# Patient Record
Sex: Female | Born: 1955
Health system: Southern US, Community
[De-identification: ages and names within clinical notes are randomized; demographics above are authoritative.]

## PROBLEM LIST (undated history)

## (undated) DIAGNOSIS — I10 Essential (primary) hypertension: Secondary | ICD-10-CM

## (undated) DIAGNOSIS — Z8781 Personal history of (healed) traumatic fracture: Secondary | ICD-10-CM

## (undated) DIAGNOSIS — E871 Hypo-osmolality and hyponatremia: Secondary | ICD-10-CM

## (undated) DIAGNOSIS — C4491 Basal cell carcinoma of skin, unspecified: Secondary | ICD-10-CM

## (undated) DIAGNOSIS — H3560 Retinal hemorrhage, unspecified eye: Secondary | ICD-10-CM

## (undated) DIAGNOSIS — I358 Other nonrheumatic aortic valve disorders: Secondary | ICD-10-CM

## (undated) DIAGNOSIS — E162 Hypoglycemia, unspecified: Secondary | ICD-10-CM

## (undated) DIAGNOSIS — F419 Anxiety disorder, unspecified: Secondary | ICD-10-CM

## (undated) DIAGNOSIS — K900 Celiac disease: Secondary | ICD-10-CM

## (undated) DIAGNOSIS — I4510 Unspecified right bundle-branch block: Secondary | ICD-10-CM

## (undated) HISTORY — DX: Hypoglycemia, unspecified: E16.2

## (undated) HISTORY — DX: Anxiety disorder, unspecified: F41.9

## (undated) HISTORY — DX: Other nonrheumatic aortic valve disorders: I35.8

## (undated) HISTORY — DX: Celiac disease: K90.0

## (undated) HISTORY — PX: APPENDECTOMY: SHX54

## (undated) HISTORY — PX: ANKLE SURGERY: SHX546

## (undated) HISTORY — DX: Unspecified right bundle-branch block: I45.10

## (undated) HISTORY — DX: Hypo-osmolality and hyponatremia: E87.1

## (undated) HISTORY — DX: Retinal hemorrhage, unspecified eye: H35.60

## (undated) HISTORY — DX: Essential (primary) hypertension: I10

## (undated) HISTORY — PX: TUBAL LIGATION: SHX77

## (undated) HISTORY — DX: Basal cell carcinoma of skin, unspecified: C44.91

---
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
## 1992-01-12 DIAGNOSIS — E162 Hypoglycemia, unspecified: Secondary | ICD-10-CM

## 1992-01-12 HISTORY — DX: Hypoglycemia, unspecified: E16.2

## 1995-01-12 HISTORY — PX: TUBAL LIGATION: SHX77

## 2006-01-11 HISTORY — PX: ANKLE FRACTURE SURGERY: SHX122

## 2008-02-05 HISTORY — PX: COLONOSCOPY: SHX174

## 2012-02-16 HISTORY — PX: UPPER GI ENDOSCOPY: SHX6162

## 2013-06-11 HISTORY — PX: COLONOSCOPY: SHX174

## 2015-11-11 DIAGNOSIS — J302 Other seasonal allergic rhinitis: Secondary | ICD-10-CM

## 2015-11-11 DIAGNOSIS — I10 Essential (primary) hypertension: Secondary | ICD-10-CM

## 2015-11-11 HISTORY — DX: Other seasonal allergic rhinitis: J30.2

## 2015-11-11 HISTORY — DX: Essential (primary) hypertension: I10

## 2016-12-30 DIAGNOSIS — M7751 Other enthesopathy of right foot: Secondary | ICD-10-CM

## 2016-12-30 DIAGNOSIS — M2041 Other hammer toe(s) (acquired), right foot: Secondary | ICD-10-CM | POA: Insufficient documentation

## 2016-12-30 HISTORY — DX: Other enthesopathy of right foot and ankle: M77.51

## 2016-12-30 HISTORY — DX: Other hammer toe(s) (acquired), right foot: M20.41

## 2017-03-02 ENCOUNTER — Encounter: Payer: Self-pay | Admitting: Gastroenterology

## 2017-05-24 ENCOUNTER — Encounter: Payer: Self-pay | Admitting: Gastroenterology

## 2017-06-22 ENCOUNTER — Encounter: Payer: Self-pay | Admitting: Gastroenterology

## 2017-06-24 ENCOUNTER — Ambulatory Visit (INDEPENDENT_AMBULATORY_CARE_PROVIDER_SITE_OTHER): Payer: BC Managed Care – PPO | Admitting: Gastroenterology

## 2017-06-24 ENCOUNTER — Encounter: Payer: Self-pay | Admitting: Gastroenterology

## 2017-06-24 VITALS — BP 110/78 | HR 57 | Ht 66.0 in | Wt 154.5 lb

## 2017-06-24 DIAGNOSIS — K900 Celiac disease: Secondary | ICD-10-CM

## 2017-06-24 NOTE — Progress Notes (Signed)
IMPRESSION and PLAN:    #1. Celiac Disease (she is compliant with  gluten-free diet-negative celiac antibodies during the last blood draw lab results from) - Lab results from Dr Felipa [facility] office.  (Normal TSH). Last EGD 02/2012-scalloping of duodenal mucosa consistent with celiac disease with positive biopsies. - Check CBC, CMP, LDH, B12, Mg today     #2. Weight Loss with fatigue with occasional left lower quadrant pain.  - Since patient has weight loss and has been compliant with gluten-free diet, we need to rule out any lymphomas.  We will proceed with CT scan of the abdomen and pelvis with p.o. and IV contrast. - FU 12 weeks, earlier if still with problems. - Next colonoscopy due 06/2023      HPI:    Chief Complaint:   Debra Mueller is a 62 y.o. female  FU celiac Extreme fatigue. Weight 169 to 154 over the last 6 months. She has been compliant with gluten-free diet. Her most recent celiac antibodies were negative consistent with compliance to gluten-free diet. She had normal TSH Has been started on Zoloft 25 mg p.o. daily with some relief. Has to get up twice at night for urination. Vague left-sided abdominal discomfort No nausea vomiting heartburn regurgitation odynophagia or dysphagia.  Last colonoscopy 06/2013 was negative except for moderate sigmoid diverticulosis.  Random biopsies were negative for microscopic colitis.  It was recommended to repeat colonoscopy in 10 years.  Earlier, if she starts having any new problems.   Past Medical History:  Diagnosis Date  . Anxiety disorder   . Basal cell carcinoma   . Celiac disease   . Hypertension   . Hypoglycemia 1994   in [place]  . Hyposmolality     Current Outpatient Medications  Medication Sig Dispense Refill  . B Complex-C (B-COMPLEX WITH VITAMIN C) tablet Take 1 tablet by mouth daily.    . Calcium Carb-Cholecalciferol (CALCIUM 600+D3 PO) Take by mouth.    . mometasone (NASONEX) 50 MCG/ACT nasal spray  Place 2 sprays into the nose daily.    . montelukast (SINGULAIR) 10 MG tablet Take 10 mg by mouth at bedtime.    . nebivolol (BYSTOLIC) 5 MG tablet Take 5 mg by mouth daily.    . sertraline (ZOLOFT) 25 MG tablet Take 25 mg by mouth daily.     No current facility-administered medications for this visit.     Past Surgical History:  Procedure Laterality Date  . ANKLE SURGERY    . APPENDECTOMY    . COLONOSCOPY  02/05/2008   Mild Sigmoid diverticulosis. Small internal hemorrhoids. Otherwise normal. Colon Redundant.  . COLONOSCOPY  06/11/2013   Moderate sigmoid diverticulosis. otherwise normal. Bx: Prominent benign lymphoid aggregate, otherwise normal  . TUBAL LIGATION    . UPPER GI ENDOSCOPY  02/16/2012   Scalloping of dupdenal mucosa, suggestive of celiac disease. Small Bowel Bx: Findings consistent with Celiac Disease.    Family History  Problem Relation Age of Onset  . Atrial fibrillation Father   . Hypertension Father   . Prostate cancer Maternal Grandfather   . Diabetes Paternal Grandfather   . Prostate cancer Maternal Uncle     Social History   Tobacco Use  . Smoking status: Former Smoker    Last attempt to quit: 2000    Years since quitting: 19.4  . Smokeless tobacco: Never Used  Substance Use Topics  . Alcohol use: Yes    Alcohol/week: 12.6 oz    Types: 21 Cans of beer  per week  . Drug use: Never    Allergies  Allergen Reactions  . Sulfa Antibiotics      Review of Systems: All systems reviewed and negative except where noted in HPI.    Physical Exam:     BP 110/78   Pulse (!) 57   Ht 5\' 6"  (1.676 m)   Wt 154 lb 8 oz (70.1 kg)   BMI 24.94 kg/m  @WEIGHTLAST3 @ GENERAL:  Alert, oriented, cooperative, not in acute distress. PSYCH: :Pleasant, normal mood and affect. HEENT:  conjunctiva pink, mucous membranes moist, neck supple without masses. No jaundice. CARDIAC:  S1 S2 normal. No murmers. PULM: Normal respiratory effort, lungs CTA bilaterally, no  wheezing. ABDOMEN: Inspection: No visible peristalsis, no abnormal pulsations, skin normal.  Palpation/percussion: Soft, nontender, nondistended, no rigidity, no abnormal dullness to percussion, no hepatosplenomegaly and no palpable abdominal masses.  Auscultation: Normal bowel sounds, no abdominal bruits. Rectal exam: Deferred SKIN:  turgor, no lesions seen. Musculoskeletal:  Normal muscle tone, normal strength. NEURO: Alert and oriented x 3, no focal neurologic deficits.    [name],MD 06/24/2017, 4:42 PM   CC Dr Laqueta Due

## 2017-06-24 NOTE — Patient Instructions (Addendum)
If you are age 62 or older, your body mass index should be between 23-30. Your Body mass index is 24.94 kg/m. If this is out of the aforementioned range listed, please consider follow up with your Primary Care Provider.  If you are age 31 or younger, your body mass index should be between 19-25. Your Body mass index is 24.94 kg/m. If this is out of the aformentioned range listed, please consider follow up with your Primary Care Provider.    You have been scheduled for a CT scan of the abdomen and pelvis [facility]  You are scheduled on  at . You should arrive 15 minutes prior to your appointment time for registration. Please follow the written instructions below on the day of your exam:  WARNING: IF YOU ARE ALLERGIC TO IODINE/X-RAY DYE, PLEASE NOTIFY RADIOLOGY IMMEDIATELY AT [phone]! YOU WILL BE GIVEN A 13 HOUR PREMEDICATION PREP.  1) Do not eat or drink anything after  (4 hours prior to your test) 2) You have been given 2 bottles of oral contrast to drink. The solution may taste better if refrigerated, but do NOT add ice or any other liquid to this solution. Shake well before drinking.    Drink 1 bottle of contrast @  (2 hours prior to your exam)  Drink 1 bottle of contrast @  (1 hour prior to your exam)  You may take any medications as prescribed with a small amount of water except for the following: Metformin, Glucophage, Glucovance, Avandamet, Riomet, Fortamet, Actoplus Met, Janumet, Glumetza or Metaglip. The above medications must be held the day of the exam AND 48 hours after the exam.  The purpose of you drinking the oral contrast is to aid in the visualization of your intestinal tract. The contrast solution may cause some diarrhea. Before your exam is started, you will be given a small amount of fluid to drink. Depending on your individual set of symptoms, you may also receive an intravenous injection of x-ray contrast/dye. Plan on being at [facility] for 30  minutes or longer, depending on the type of exam you are having performed.  This test typically takes 30-45 minutes to complete.  If you have any questions regarding your exam or if you need to reschedule, you may call the CT department at [phone] between the hours of 8:00 am and 5:00 pm, Monday-Friday.  ________________________________________________________________________  We have scheduled you an appointment to come back and have labs drawn.    Thank you,  Dr. Jackquline Denmark

## 2017-06-27 ENCOUNTER — Other Ambulatory Visit: Payer: BC Managed Care – PPO

## 2017-06-28 ENCOUNTER — Other Ambulatory Visit (INDEPENDENT_AMBULATORY_CARE_PROVIDER_SITE_OTHER): Payer: BC Managed Care – PPO

## 2017-06-28 DIAGNOSIS — K900 Celiac disease: Secondary | ICD-10-CM | POA: Diagnosis not present

## 2017-06-28 LAB — COMPREHENSIVE METABOLIC PANEL
ALT: 15 U/L (ref 0–35)
AST: 17 U/L (ref 0–37)
Albumin: 4.5 g/dL (ref 3.5–5.2)
Alkaline Phosphatase: 70 U/L (ref 39–117)
BUN: 10 mg/dL (ref 6–23)
CALCIUM: 9.7 mg/dL (ref 8.4–10.5)
CHLORIDE: 96 meq/L (ref 96–112)
CO2: 30 mEq/L (ref 19–32)
Creatinine, Ser: 0.61 mg/dL (ref 0.40–1.20)
GFR: 105.5 mL/min (ref >60.00)
Glucose, Bld: 90 mg/dL (ref 70–99)
POTASSIUM: 4.9 meq/L (ref 3.5–5.1)
Sodium: 133 mEq/L — ABNORMAL LOW (ref 135–145)
Total Bilirubin: 0.5 mg/dL (ref 0.2–1.2)
Total Protein: 6.9 g/dL (ref 6.0–8.3)

## 2017-06-28 LAB — CBC WITH DIFFERENTIAL/PLATELET
BASOS PCT: 0.6 % (ref 0.0–3.0)
Basophils Absolute: 0 10*3/uL (ref 0.0–0.1)
EOS PCT: 1.8 % (ref 0.0–5.0)
Eosinophils Absolute: 0.1 10*3/uL (ref 0.0–0.7)
HEMATOCRIT: 40.5 % (ref 36.0–46.0)
HEMOGLOBIN: 13.9 g/dL (ref 12.0–15.0)
LYMPHS PCT: 36 % (ref 12.0–46.0)
Lymphs Abs: 1.5 10*3/uL (ref 0.7–4.0)
MCHC: 34.2 g/dL (ref 30.0–36.0)
MCV: 94.4 fl (ref 78.0–100.0)
MONOS PCT: 11.8 % (ref 3.0–12.0)
Monocytes Absolute: 0.5 10*3/uL (ref 0.1–1.0)
Neutro Abs: 2.1 10*3/uL (ref 1.4–7.7)
Neutrophils Relative %: 49.8 % (ref 43.0–77.0)
PLATELETS: 231 10*3/uL (ref 150.0–400.0)
RBC: 4.29 Mil/uL (ref 3.87–5.11)
RDW: 13.1 % (ref 11.5–15.5)
WBC: 4.2 10*3/uL (ref 4.0–10.5)

## 2017-06-28 LAB — LACTATE DEHYDROGENASE: LDH: 126 U/L (ref 120–250)

## 2017-06-28 LAB — VITAMIN B12: Vitamin B-12: 307 pg/mL (ref 211–911)

## 2017-06-30 ENCOUNTER — Encounter (HOSPITAL_BASED_OUTPATIENT_CLINIC_OR_DEPARTMENT_OTHER): Payer: Self-pay

## 2017-06-30 ENCOUNTER — Ambulatory Visit (HOSPITAL_BASED_OUTPATIENT_CLINIC_OR_DEPARTMENT_OTHER)
Admission: RE | Admit: 2017-06-30 | Discharge: 2017-06-30 | Disposition: A | Discharge status: Home / Self Care | Payer: BC Managed Care – PPO | Source: Ambulatory Visit | Attending: Gastroenterology | Admitting: Gastroenterology

## 2017-06-30 DIAGNOSIS — K900 Celiac disease: Secondary | ICD-10-CM | POA: Diagnosis not present

## 2017-06-30 DIAGNOSIS — I700 Atherosclerosis of aorta: Secondary | ICD-10-CM | POA: Insufficient documentation

## 2017-06-30 MED ORDER — IOPAMIDOL (ISOVUE-300) INJECTION 61%
100.0000 mL | Freq: Once | INTRAVENOUS | Status: AC | PRN
  Administered 2017-06-30: 100 mL via INTRAVENOUS

## 2017-07-01 ENCOUNTER — Telehealth: Payer: Self-pay | Admitting: Gastroenterology

## 2017-07-01 NOTE — Telephone Encounter (Signed)
Pt calling again regarding her results. Pls call her.

## 2017-07-04 NOTE — Telephone Encounter (Signed)
I have not been able to reach her by phone so I mailed a copy of the CT report to her.

## 2017-12-05 ENCOUNTER — Ambulatory Visit: Payer: BC Managed Care – PPO | Admitting: Gastroenterology

## 2017-12-05 ENCOUNTER — Encounter: Payer: Self-pay | Admitting: Gastroenterology

## 2017-12-05 VITALS — BP 140/80 | HR 60 | Ht 66.0 in | Wt 159.1 lb

## 2017-12-05 DIAGNOSIS — K900 Celiac disease: Secondary | ICD-10-CM | POA: Diagnosis not present

## 2017-12-05 NOTE — Progress Notes (Signed)
IMPRESSION and PLAN:    #1. Celiac Disease (she is compliant with  gluten-free diet-negative celiac antibodies confirming compliance, normal TSH. Last EGD 02/2012-scalloping of duodenal mucosa consistent with celiac disease with positive biopsies. Normal CBC, CMP, LDH, B12.  #2. Weight Loss with fatigue with occasional left lower quadrant pain. Neg CT abdomen/pelvis 06/2017 for any lymphoma.  Weight loss has resolved.  Plan: -FU 12 weeks, earlier if still with problems.  If still with problems, will consider repeat EGD. -She is to follow-up with Dr. Laqueta Due regarding fatigue.  No etiology has been found.  Consider increasing Zoloft to 50 mg p.o. once a day, consider sleep study to rule out sleep apnea as patient's husband did tell me today that she has been snoring. -Next colonoscopy due 06/2023. -Discussed plan with the patient and patient's husband in detail.  I also given them a copy of the CT scan and labs.      HPI:    Chief Complaint:   Debra Mueller is a 62 y.o. female  FU celiac Doing somewhat better except for continued fatigue. She had negative CT scan of the abdomen and pelvis. She had normal labs including CBC, CMP except for mildly decreased sodium, B12 and LDH. Has been eating well and has gained weight from 154pounds to 159 pounds today. Extreme fatigue. Weight 169 to 154 over the last 6 months. Husband did tell me that she has been snoring. No nausea vomiting heartburn regurgitation odynophagia or dysphagia.  Last colonoscopy 06/2013 was negative except for moderate sigmoid diverticulosis.  Random biopsies were negative for microscopic colitis.  It was recommended to repeat colonoscopy in 10 years.  Earlier, if she starts having any new problems.  CT 06/2017 iMPRESSION: 1. No findings to suggest lymphoma. 2. No acute findings are noted in the abdomen or pelvis. 3. Aortic atherosclerosis. 4. Additional incidental findings, as above. Past Medical History:    Diagnosis Date  . Anxiety disorder   . Basal cell carcinoma   . Celiac disease   . Hypertension   . Hypoglycemia 1994   in [place]  . Hyposmolality     Current Outpatient Medications  Medication Sig Dispense Refill  . B Complex-C (B-COMPLEX WITH VITAMIN C) tablet Take 1 tablet by mouth daily.    . Calcium Carb-Cholecalciferol (CALCIUM 600+D3 PO) Take by mouth.    . mometasone (NASONEX) 50 MCG/ACT nasal spray Place 2 sprays into the nose daily.    . montelukast (SINGULAIR) 10 MG tablet Take 10 mg by mouth at bedtime.    . nebivolol (BYSTOLIC) 5 MG tablet Take 5 mg by mouth daily.    . sertraline (ZOLOFT) 25 MG tablet Take 25 mg by mouth daily.     No current facility-administered medications for this visit.     Past Surgical History:  Procedure Laterality Date  . ANKLE SURGERY    . APPENDECTOMY    . COLONOSCOPY  02/05/2008   Mild Sigmoid diverticulosis. Small internal hemorrhoids. Otherwise normal. Colon Redundant.  . COLONOSCOPY  06/11/2013   Moderate sigmoid diverticulosis. otherwise normal. Bx: Prominent benign lymphoid aggregate, otherwise normal  . TUBAL LIGATION    . UPPER GI ENDOSCOPY  02/16/2012   Scalloping of dupdenal mucosa, suggestive of celiac disease. Small Bowel Bx: Findings consistent with Celiac Disease.    Family History  Problem Relation Age of Onset  . Atrial fibrillation Father   . Hypertension Father   . Prostate cancer Maternal Grandfather   . Diabetes Paternal Grandfather   .  Prostate cancer Maternal Uncle   . Colon cancer Neg Hx     Social History   Tobacco Use  . Smoking status: Former Smoker    Last attempt to quit: 2000    Years since quitting: 19.9  . Smokeless tobacco: Never Used  Substance Use Topics  . Alcohol use: Yes    Alcohol/week: 21.0 standard drinks    Types: 21 Cans of beer per week  . Drug use: Never    Allergies  Allergen Reactions  . Sulfa Antibiotics      Review of Systems: All systems reviewed and  negative except where noted in HPI.    Physical Exam:     BP 140/80   Pulse 60   Ht 5\' 6"  (1.676 m)   Wt 159 lb 2 oz (72.2 kg)   BMI 25.68 kg/m  @WEIGHTLAST3 @ GENERAL:  Alert, oriented, cooperative, not in acute distress. PSYCH: :Pleasant, normal mood and affect. HEENT:  conjunctiva pink, mucous membranes moist, neck supple without masses. No jaundice. CARDIAC:  S1 S2 normal. No murmers. PULM: Normal respiratory effort, lungs CTA bilaterally, no wheezing. ABDOMEN: Inspection: No visible peristalsis, no abnormal pulsations, skin normal.  Palpation/percussion: Soft, nontender, nondistended, no rigidity, no abnormal dullness to percussion, no hepatosplenomegaly and no palpable abdominal masses.  Auscultation: Normal bowel sounds, no abdominal bruits. Rectal exam: Deferred SKIN:  turgor, no lesions seen. Musculoskeletal:  Normal muscle tone, normal strength. NEURO: Alert and oriented x 3, no focal neurologic deficits. I spent 15 minutes of face-to-face time with the patient. Greater than 50% of the time was spent counseling and coordinating care.    Cristo Ausburn,MD 12/05/2017, 11:52 AM   CC Dr Laqueta Due

## 2017-12-05 NOTE — Patient Instructions (Signed)
If you are age 62 or older, your body mass index should be between 23-30. Your Body mass index is 25.68 kg/m. If this is out of the aforementioned range listed, please consider follow up with your Primary Care Provider.  If you are age 4 or younger, your body mass index should be between 19-25. Your Body mass index is 25.68 kg/m. If this is out of the aformentioned range listed, please consider follow up with your Primary Care Provider.   Thank you,  Dr. Jackquline Denmark

## 2019-08-16 IMAGING — CT CT ABD-PELV W/ CM
2 of 5 series · 16 of 46 positions shown, 18 images · IV contrast (APPLIED)
Comparison: No priors.

CLINICAL DATA: 62-year-old female with history of celiac disease.
Fatigue for several years. Evaluate for potential lymphoma.

EXAM:
CT ABDOMEN AND PELVIS WITH CONTRAST
TECHNIQUE: Multidetector CT imaging of the abdomen and pelvis was performed
using the standard protocol following bolus administration of
intravenous contrast.
CONTRAST:  100mL 0L1VDV-UAA IOPAMIDOL (0L1VDV-UAA) INJECTION 61%

[Series 2: axial st · axial · 0.91mm/px · z∈[-456,-56]mm · 13 of 91 slices shown, 15 images]
[im 6/91  soft-tissue]
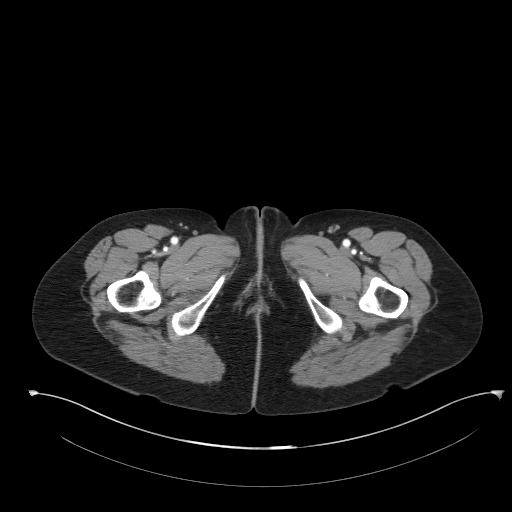
[im 6/91  bone]
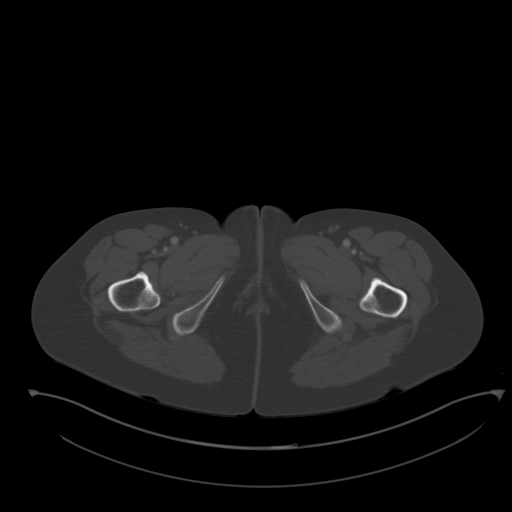
[im 11/91  soft-tissue]
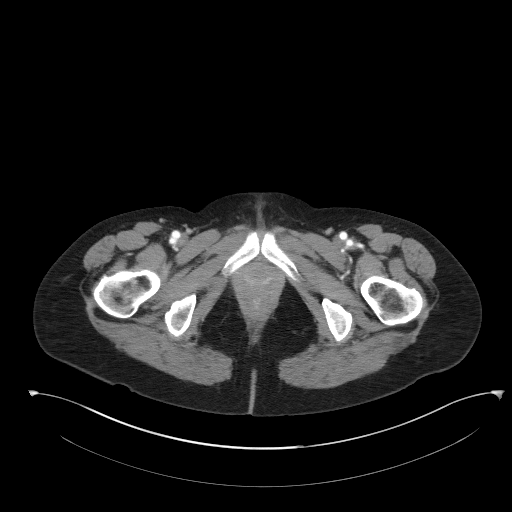
[im 21/91  soft-tissue]
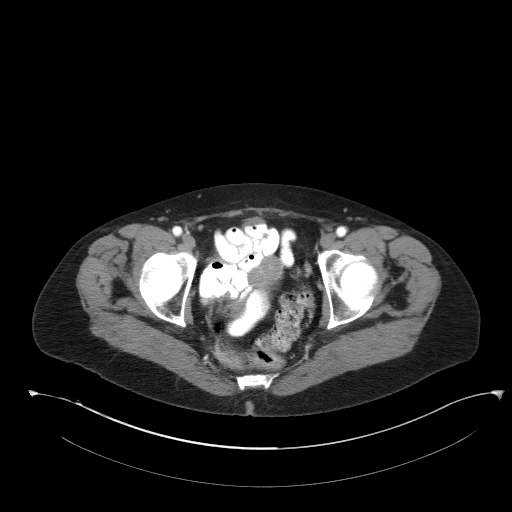
[im 26/91  soft-tissue]
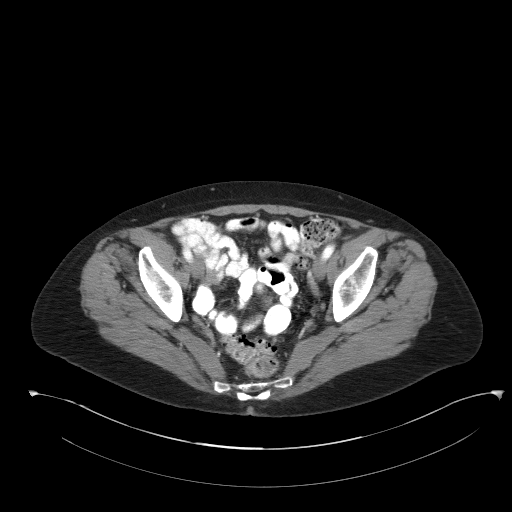
[im 31/91  soft-tissue]
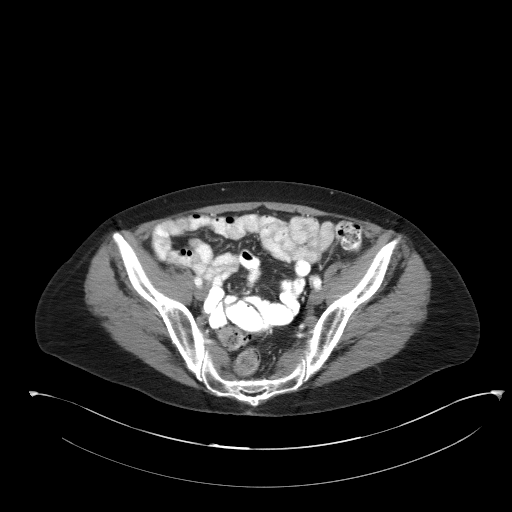
[im 41/91  soft-tissue]
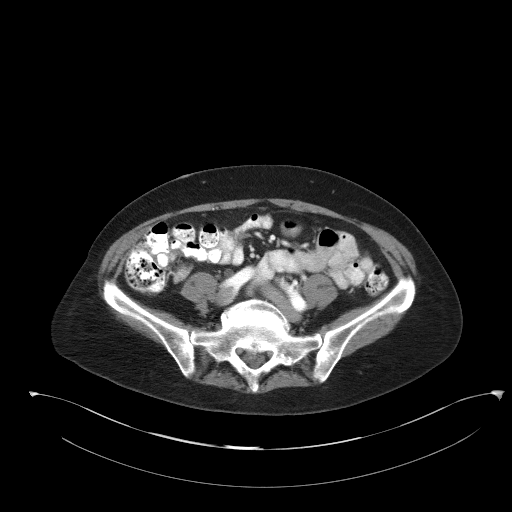
[im 46/91  soft-tissue]
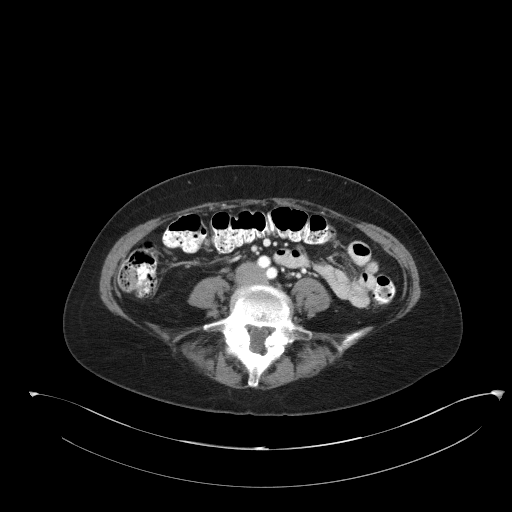
[im 51/91  soft-tissue]
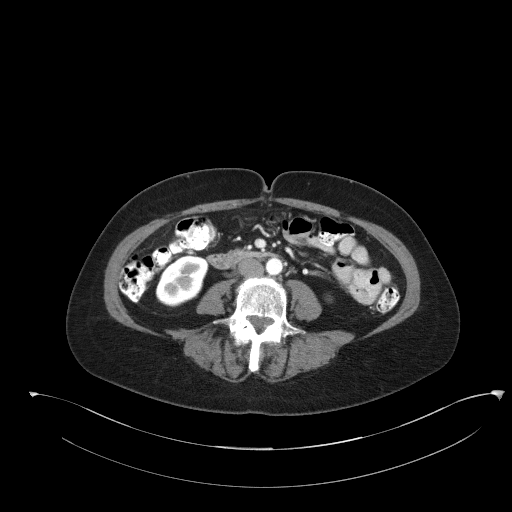
[im 61/91  soft-tissue]
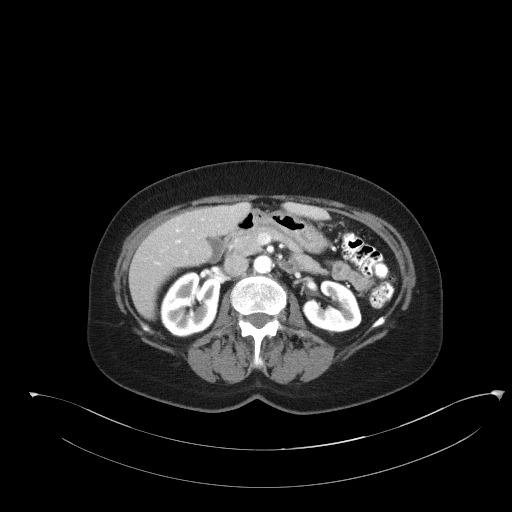
[im 61/91  bone]
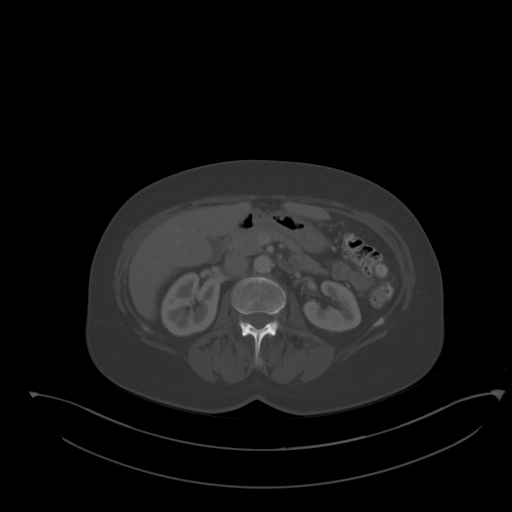
[im 66/91  soft-tissue]
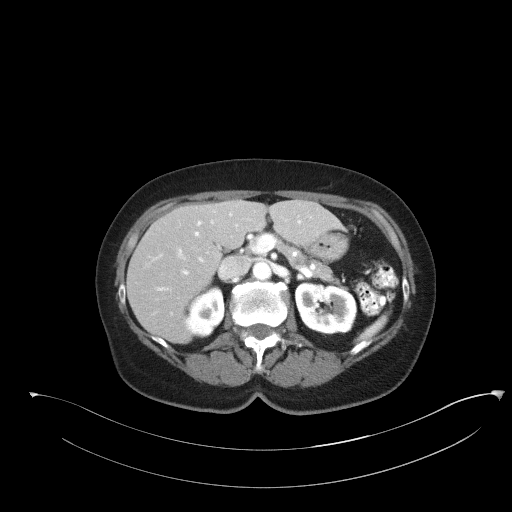
[im 71/91  soft-tissue]
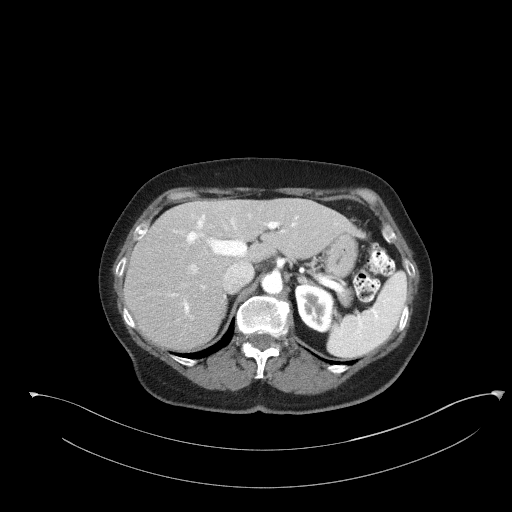
[im 81/91  soft-tissue]
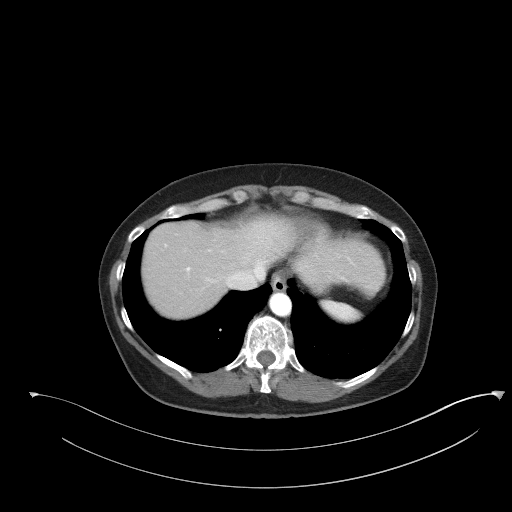
[im 86/91  soft-tissue]
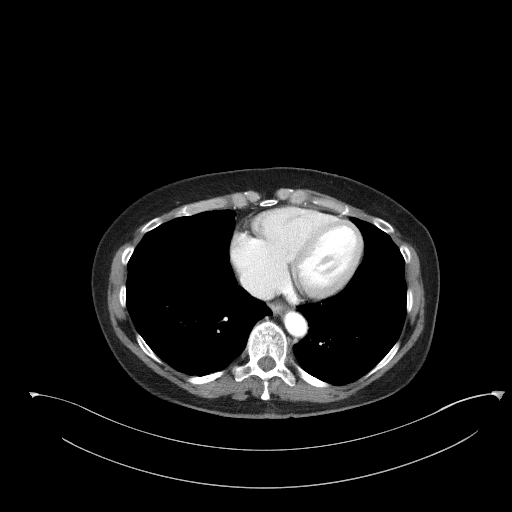

[Series 5: coronal st · coronal · 0.73mm/px · 3 of 82 slices shown]
[im 28/82  soft-tissue]
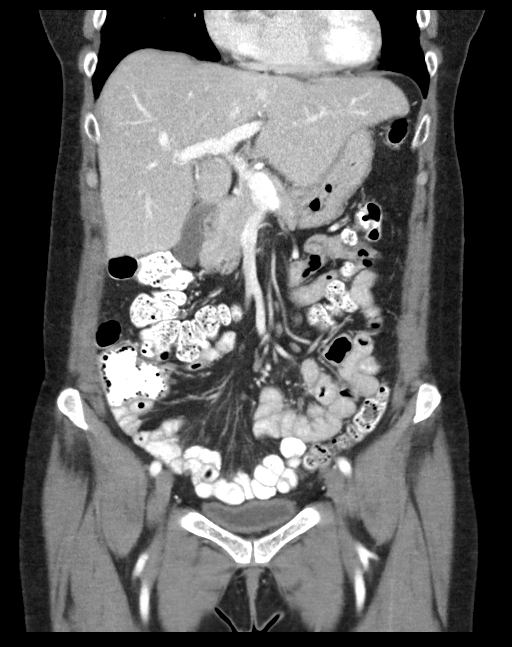
[im 37/82  soft-tissue]
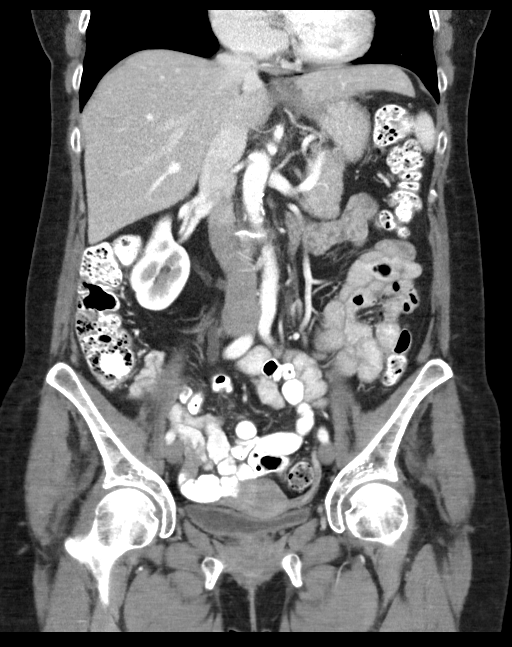
[im 46/82  soft-tissue]
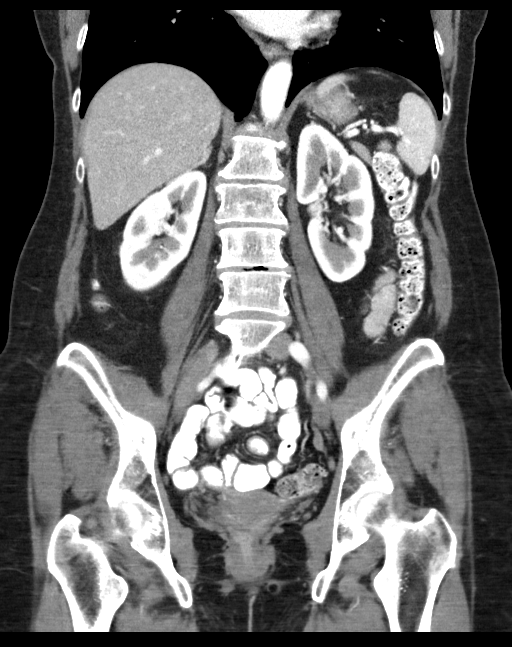

[16 of 46 positions shown; findings below may reference images not displayed]

FINDINGS: Lower chest: Unremarkable.

Hepatobiliary: No suspicious cystic or solid hepatic lesions. No
intra or extrahepatic biliary ductal dilatation. Gallbladder is
normal in appearance.

Pancreas: No pancreatic mass. No pancreatic ductal dilatation. No
pancreatic or peripancreatic fluid or inflammatory changes.

Spleen: Unremarkable.

Adrenals/Urinary Tract: Bilateral kidneys and bilateral adrenal
glands are normal in appearance. There is no hydroureteronephrosis.
Urinary bladder is nearly decompressed, but otherwise unremarkable
in appearance.

Stomach/Bowel: Normal appearance of the stomach. No pathologic
dilatation of small bowel or colon. The appendix is not confidently
identified and may be surgically absent. Regardless, there are no
inflammatory changes noted adjacent to the cecum to suggest the
presence of an acute appendicitis at this time.

Vascular/Lymphatic: Aortic atherosclerosis. No lymphadenopathy noted
in the abdomen or pelvis.

Reproductive: Small lesions associated with the posterior aspect of
the uterine body and fundus measuring up to 2.5 cm, likely to
represent small fibroids. Ovaries are unremarkable in appearance.

Other: No significant volume of ascites.  No pneumoperitoneum.

Musculoskeletal: Bilateral pars defects at L5. There are no
aggressive appearing lytic or blastic lesions noted in the
visualized portions of the skeleton.
IMPRESSION: 1. No findings to suggest lymphoma.
2. No acute findings are noted in the abdomen or pelvis.
3. Aortic atherosclerosis.
4. Additional incidental findings, as above.

Aortic Atherosclerosis ([id number]).

## 2020-02-12 HISTORY — PX: CATARACT EXTRACTION: SUR2

## 2020-07-30 DIAGNOSIS — H40013 Open angle with borderline findings, low risk, bilateral: Secondary | ICD-10-CM | POA: Diagnosis not present

## 2020-07-30 DIAGNOSIS — H04123 Dry eye syndrome of bilateral lacrimal glands: Secondary | ICD-10-CM | POA: Diagnosis not present

## 2020-10-03 DIAGNOSIS — M79642 Pain in left hand: Secondary | ICD-10-CM | POA: Diagnosis not present

## 2020-10-03 DIAGNOSIS — S82024A Nondisplaced longitudinal fracture of right patella, initial encounter for closed fracture: Secondary | ICD-10-CM | POA: Diagnosis not present

## 2020-10-03 DIAGNOSIS — M25561 Pain in right knee: Secondary | ICD-10-CM | POA: Diagnosis not present

## 2020-10-16 DIAGNOSIS — M25561 Pain in right knee: Secondary | ICD-10-CM | POA: Diagnosis not present

## 2020-10-24 DIAGNOSIS — M25661 Stiffness of right knee, not elsewhere classified: Secondary | ICD-10-CM | POA: Diagnosis not present

## 2020-10-24 DIAGNOSIS — R2689 Other abnormalities of gait and mobility: Secondary | ICD-10-CM | POA: Diagnosis not present

## 2020-10-24 DIAGNOSIS — M25561 Pain in right knee: Secondary | ICD-10-CM | POA: Diagnosis not present

## 2020-10-24 DIAGNOSIS — R531 Weakness: Secondary | ICD-10-CM | POA: Diagnosis not present

## 2020-10-27 DIAGNOSIS — R531 Weakness: Secondary | ICD-10-CM | POA: Diagnosis not present

## 2020-10-27 DIAGNOSIS — R2689 Other abnormalities of gait and mobility: Secondary | ICD-10-CM | POA: Diagnosis not present

## 2020-10-27 DIAGNOSIS — M25661 Stiffness of right knee, not elsewhere classified: Secondary | ICD-10-CM | POA: Diagnosis not present

## 2020-10-27 DIAGNOSIS — M25561 Pain in right knee: Secondary | ICD-10-CM | POA: Diagnosis not present

## 2020-10-29 DIAGNOSIS — M25661 Stiffness of right knee, not elsewhere classified: Secondary | ICD-10-CM | POA: Diagnosis not present

## 2020-10-29 DIAGNOSIS — R2689 Other abnormalities of gait and mobility: Secondary | ICD-10-CM | POA: Diagnosis not present

## 2020-10-29 DIAGNOSIS — R531 Weakness: Secondary | ICD-10-CM | POA: Diagnosis not present

## 2020-10-29 DIAGNOSIS — M25561 Pain in right knee: Secondary | ICD-10-CM | POA: Diagnosis not present

## 2020-10-31 DIAGNOSIS — M25561 Pain in right knee: Secondary | ICD-10-CM | POA: Diagnosis not present

## 2020-10-31 DIAGNOSIS — M25661 Stiffness of right knee, not elsewhere classified: Secondary | ICD-10-CM | POA: Diagnosis not present

## 2020-10-31 DIAGNOSIS — R2689 Other abnormalities of gait and mobility: Secondary | ICD-10-CM | POA: Diagnosis not present

## 2020-10-31 DIAGNOSIS — R531 Weakness: Secondary | ICD-10-CM | POA: Diagnosis not present

## 2020-11-03 DIAGNOSIS — M25561 Pain in right knee: Secondary | ICD-10-CM | POA: Diagnosis not present

## 2020-11-03 DIAGNOSIS — M25661 Stiffness of right knee, not elsewhere classified: Secondary | ICD-10-CM | POA: Diagnosis not present

## 2020-11-03 DIAGNOSIS — R2689 Other abnormalities of gait and mobility: Secondary | ICD-10-CM | POA: Diagnosis not present

## 2020-11-03 DIAGNOSIS — R531 Weakness: Secondary | ICD-10-CM | POA: Diagnosis not present

## 2020-11-05 DIAGNOSIS — M25661 Stiffness of right knee, not elsewhere classified: Secondary | ICD-10-CM | POA: Diagnosis not present

## 2020-11-05 DIAGNOSIS — M25561 Pain in right knee: Secondary | ICD-10-CM | POA: Diagnosis not present

## 2020-11-05 DIAGNOSIS — R531 Weakness: Secondary | ICD-10-CM | POA: Diagnosis not present

## 2020-11-05 DIAGNOSIS — R2689 Other abnormalities of gait and mobility: Secondary | ICD-10-CM | POA: Diagnosis not present

## 2020-11-07 DIAGNOSIS — M25661 Stiffness of right knee, not elsewhere classified: Secondary | ICD-10-CM | POA: Diagnosis not present

## 2020-11-07 DIAGNOSIS — R2689 Other abnormalities of gait and mobility: Secondary | ICD-10-CM | POA: Diagnosis not present

## 2020-11-07 DIAGNOSIS — R531 Weakness: Secondary | ICD-10-CM | POA: Diagnosis not present

## 2020-11-07 DIAGNOSIS — M25561 Pain in right knee: Secondary | ICD-10-CM | POA: Diagnosis not present

## 2020-11-10 DIAGNOSIS — Z1151 Encounter for screening for human papillomavirus (HPV): Secondary | ICD-10-CM | POA: Diagnosis not present

## 2020-11-10 DIAGNOSIS — M25561 Pain in right knee: Secondary | ICD-10-CM | POA: Diagnosis not present

## 2020-11-10 DIAGNOSIS — Z01419 Encounter for gynecological examination (general) (routine) without abnormal findings: Secondary | ICD-10-CM | POA: Diagnosis not present

## 2020-11-10 DIAGNOSIS — R531 Weakness: Secondary | ICD-10-CM | POA: Diagnosis not present

## 2020-11-10 DIAGNOSIS — M25661 Stiffness of right knee, not elsewhere classified: Secondary | ICD-10-CM | POA: Diagnosis not present

## 2020-11-10 DIAGNOSIS — Z124 Encounter for screening for malignant neoplasm of cervix: Secondary | ICD-10-CM | POA: Diagnosis not present

## 2020-11-10 DIAGNOSIS — R2689 Other abnormalities of gait and mobility: Secondary | ICD-10-CM | POA: Diagnosis not present

## 2020-11-12 DIAGNOSIS — M25561 Pain in right knee: Secondary | ICD-10-CM | POA: Diagnosis not present

## 2020-11-14 DIAGNOSIS — R2689 Other abnormalities of gait and mobility: Secondary | ICD-10-CM | POA: Diagnosis not present

## 2020-11-14 DIAGNOSIS — M25661 Stiffness of right knee, not elsewhere classified: Secondary | ICD-10-CM | POA: Diagnosis not present

## 2020-11-14 DIAGNOSIS — M25561 Pain in right knee: Secondary | ICD-10-CM | POA: Diagnosis not present

## 2020-11-14 DIAGNOSIS — R531 Weakness: Secondary | ICD-10-CM | POA: Diagnosis not present

## 2020-11-15 DIAGNOSIS — S52501A Unspecified fracture of the lower end of right radius, initial encounter for closed fracture: Secondary | ICD-10-CM | POA: Diagnosis not present

## 2020-11-15 DIAGNOSIS — M25531 Pain in right wrist: Secondary | ICD-10-CM | POA: Diagnosis not present

## 2020-11-18 DIAGNOSIS — G8918 Other acute postprocedural pain: Secondary | ICD-10-CM | POA: Diagnosis not present

## 2020-11-18 DIAGNOSIS — S52501A Unspecified fracture of the lower end of right radius, initial encounter for closed fracture: Secondary | ICD-10-CM

## 2020-11-18 DIAGNOSIS — M25531 Pain in right wrist: Secondary | ICD-10-CM | POA: Diagnosis not present

## 2020-11-18 HISTORY — DX: Other acute postprocedural pain: G89.18

## 2020-11-18 HISTORY — DX: Unspecified fracture of the lower end of right radius, initial encounter for closed fracture: S52.501A

## 2020-11-19 ENCOUNTER — Encounter (HOSPITAL_COMMUNITY): Payer: Self-pay | Admitting: Orthopedic Surgery

## 2020-11-19 ENCOUNTER — Other Ambulatory Visit: Payer: Self-pay

## 2020-11-19 DIAGNOSIS — I10 Essential (primary) hypertension: Secondary | ICD-10-CM | POA: Diagnosis not present

## 2020-11-19 DIAGNOSIS — Z79899 Other long term (current) drug therapy: Secondary | ICD-10-CM | POA: Diagnosis not present

## 2020-11-19 DIAGNOSIS — Z1159 Encounter for screening for other viral diseases: Secondary | ICD-10-CM | POA: Diagnosis not present

## 2020-11-19 DIAGNOSIS — K900 Celiac disease: Secondary | ICD-10-CM | POA: Diagnosis not present

## 2020-11-19 DIAGNOSIS — E781 Pure hyperglyceridemia: Secondary | ICD-10-CM | POA: Diagnosis not present

## 2020-11-19 DIAGNOSIS — Z0000 Encounter for general adult medical examination without abnormal findings: Secondary | ICD-10-CM | POA: Diagnosis not present

## 2020-11-19 DIAGNOSIS — Z131 Encounter for screening for diabetes mellitus: Secondary | ICD-10-CM | POA: Diagnosis not present

## 2020-11-19 DIAGNOSIS — Z1231 Encounter for screening mammogram for malignant neoplasm of breast: Secondary | ICD-10-CM | POA: Diagnosis not present

## 2020-11-19 DIAGNOSIS — Z7189 Other specified counseling: Secondary | ICD-10-CM | POA: Diagnosis not present

## 2020-11-19 DIAGNOSIS — Z1331 Encounter for screening for depression: Secondary | ICD-10-CM | POA: Diagnosis not present

## 2020-11-19 DIAGNOSIS — E2839 Other primary ovarian failure: Secondary | ICD-10-CM | POA: Diagnosis not present

## 2020-11-19 NOTE — Progress Notes (Signed)
For Short Stay: [place] appointment date: N/A Date of COVID positive in last 66 days: N/A   For Anesthesia: PCP - Ernestene Kiel, MD Cardiologist - N/A  Chest x-ray - N/A EKG - N/A Stress Test - N/A ECHO - N/A Cardiac Cath - N/A Pacemaker/ICD device last checked: N/A  Sleep Study - N/A CPAP - N/A  Fasting Blood Sugar - N/A Checks Blood Sugar ___N/A__ times a day  Blood Thinner Instructions:N/A Aspirin Instructions:  N/A Last Dose:N/A  Activity level: Can go up a flight of stairs and activities of daily living without stopping and without chest pain and/or shortness of breath     Anesthesia review: N/A  Patient denies shortness of breath, fever, cough and chest pain at PAT appointment

## 2020-11-19 NOTE — H&P (Signed)
Preoperative History & Physical Exam  Surgeon: Matt Holmes, MD  Diagnosis: Right wrist fracture  Planned Procedure: Procedure(s) (LRB): OPEN REDUCTION INTERNAL FIXATION (ORIF) WRIST FRACTURE (Right)  History of Present Illness:   Patient is a 65 y.o. female with symptoms consistent with  Right wrist fracture who presents for surgical intervention. The risks, benefits and alternatives of surgical intervention were discussed and informed consent was obtained prior to surgery.  Past Medical History:  Past Medical History:  Diagnosis Date   Anxiety disorder    Basal cell carcinoma    Celiac disease    History of fractured kneecap    Right knee 6 weeks ago 11/19/20   Hypertension    Hypoglycemia 1994   in [place]   Hyposmolality     Past Surgical History:  Past Surgical History:  Procedure Laterality Date   ANKLE SURGERY     APPENDECTOMY     COLONOSCOPY  02/05/2008   Mild Sigmoid diverticulosis. Small internal hemorrhoids. Otherwise normal. Colon Redundant.   COLONOSCOPY  06/11/2013   Moderate sigmoid diverticulosis. otherwise normal. Bx: Prominent benign lymphoid aggregate, otherwise normal   TUBAL LIGATION     UPPER GI ENDOSCOPY  02/16/2012   Scalloping of dupdenal mucosa, suggestive of celiac disease. Small Bowel Bx: Findings consistent with Celiac Disease.    Medications:  Prior to Admission medications   Medication Sig Start Date End Date Taking? Authorizing Provider  B Complex-C (B-COMPLEX WITH VITAMIN C) tablet Take 1 tablet by mouth in the morning.   Yes [provider]  Calcium Carb-Cholecalciferol (CALCIUM 600+D3 PO) Take 1 tablet by mouth in the morning.   Yes [provider]  carboxymethylcellul-glycerin (LUBRICANT DROPS/DUAL-ACTION) 0.5-0.9 % ophthalmic solution Place 1-2 drops into both eyes 3 (three) times daily as needed for dry eyes.   Yes [provider]  ibuprofen (ADVIL) 200 MG tablet Take 400-1,000 mg by mouth every 8  (eight) hours as needed (for pain.).   Yes [provider]  mometasone (NASONEX) 50 MCG/ACT nasal spray Place 2 sprays into the nose every evening.   Yes [provider]  montelukast (SINGULAIR) 10 MG tablet Take 10 mg by mouth at bedtime.   Yes [provider]  nebivolol (BYSTOLIC) 5 MG tablet Take 5 mg by mouth daily.   Yes [provider]  sertraline (ZOLOFT) 25 MG tablet Take 25 mg by mouth daily.   Yes [provider]    Allergies:  Gluten meal and Sulfa antibiotics  Review of Systems: Negative except per HPI.  Physical Exam: Alert and oriented, NAD Head and neck: no masses, normal alignment CV: pulse intact Pulm: no increased work of breathing, respirations even and unlabored Abdomen: non-distended Extremities: extremities warm and well perfused  LABS: No results found for this or any previous visit (from the past 2160 hour(s)).   Complete History and Physical exam available in the office notes  Debra Mueller

## 2020-11-19 NOTE — Progress Notes (Addendum)
Spoke w/ via phone for pre-op interview---pt Lab needs dos----I stat, ekg  per anesthesia, surgery orders req dr Greta Doom epic ib              Lab results------none COVID test -----patient states asymptomatic no test needed Arrive at -------815 am 11-20-2020 NPO after MN NO Solid Food.  Clear liquids from MN until---715 am  Med rec completed Medications to take morning of surgery -----bystolic, certraline Diabetic medication -----n/a Patient instructed no nail polish to be worn day of surgery Patient instructed to bring photo id and insurance card day of surgery Patient aware to have Driver (ride ) / caregiver    for 24 hours after surgery fred husband Patient Special Instructions -----none Pre-Op special Istructions -----none Patient verbalized understanding of instructions that were given at this phone interview. Patient denies shortness of breath, chest pain, fever, cough at this phone interview.

## 2020-11-20 ENCOUNTER — Ambulatory Visit (HOSPITAL_BASED_OUTPATIENT_CLINIC_OR_DEPARTMENT_OTHER): Payer: Medicare PPO | Admitting: Physician Assistant

## 2020-11-20 ENCOUNTER — Encounter (HOSPITAL_BASED_OUTPATIENT_CLINIC_OR_DEPARTMENT_OTHER)
Admission: RE | Disposition: A | Discharge status: Home / Self Care | Payer: Self-pay | Source: Home / Self Care | Attending: Orthopedic Surgery

## 2020-11-20 ENCOUNTER — Encounter (HOSPITAL_BASED_OUTPATIENT_CLINIC_OR_DEPARTMENT_OTHER): Payer: Self-pay | Admitting: Orthopedic Surgery

## 2020-11-20 ENCOUNTER — Ambulatory Visit (HOSPITAL_COMMUNITY)
Admission: RE | Admit: 2020-11-20 | Discharge: 2020-11-20 | Disposition: A | Discharge status: Home / Self Care | Payer: Medicare PPO | Attending: Orthopedic Surgery | Admitting: Orthopedic Surgery

## 2020-11-20 DIAGNOSIS — I4510 Unspecified right bundle-branch block: Secondary | ICD-10-CM | POA: Insufficient documentation

## 2020-11-20 DIAGNOSIS — S52571A Other intraarticular fracture of lower end of right radius, initial encounter for closed fracture: Secondary | ICD-10-CM | POA: Diagnosis not present

## 2020-11-20 DIAGNOSIS — X58XXXA Exposure to other specified factors, initial encounter: Secondary | ICD-10-CM | POA: Insufficient documentation

## 2020-11-20 DIAGNOSIS — K900 Celiac disease: Secondary | ICD-10-CM | POA: Diagnosis not present

## 2020-11-20 DIAGNOSIS — S52501A Unspecified fracture of the lower end of right radius, initial encounter for closed fracture: Secondary | ICD-10-CM | POA: Diagnosis not present

## 2020-11-20 DIAGNOSIS — S62101A Fracture of unspecified carpal bone, right wrist, initial encounter for closed fracture: Secondary | ICD-10-CM

## 2020-11-20 DIAGNOSIS — I10 Essential (primary) hypertension: Secondary | ICD-10-CM | POA: Insufficient documentation

## 2020-11-20 DIAGNOSIS — F419 Anxiety disorder, unspecified: Secondary | ICD-10-CM | POA: Diagnosis not present

## 2020-11-20 HISTORY — PX: ORIF WRIST FRACTURE: SHX2133

## 2020-11-20 HISTORY — DX: Personal history of (healed) traumatic fracture: Z87.81

## 2020-11-20 LAB — POCT I-STAT, CHEM 8
BUN: 14 mg/dL (ref 8–23)
Calcium, Ion: 1.24 mmol/L (ref 1.15–1.40)
Chloride: 99 mmol/L (ref 98–111)
Creatinine, Ser: 0.6 mg/dL (ref 0.44–1.00)
Glucose, Bld: 91 mg/dL (ref 70–99)
HCT: 42 % (ref 36.0–46.0)
Hemoglobin: 14.3 g/dL (ref 12.0–15.0)
Potassium: 4.5 mmol/L (ref 3.5–5.1)
Sodium: 135 mmol/L (ref 135–145)
TCO2: 25 mmol/L (ref 22–32)

## 2020-11-20 SURGERY — OPEN REDUCTION INTERNAL FIXATION (ORIF) WRIST FRACTURE
Anesthesia: Monitor Anesthesia Care | Site: Wrist | Laterality: Right

## 2020-11-20 MED ORDER — MIDAZOLAM HCL 5 MG/5ML IJ SOLN
INTRAMUSCULAR | Status: DC | PRN
  Administered 2020-11-20: 2 mg via INTRAVENOUS

## 2020-11-20 MED ORDER — FENTANYL CITRATE (PF) 100 MCG/2ML IJ SOLN
INTRAMUSCULAR | Status: AC
  Filled 2020-11-20: qty 2

## 2020-11-20 MED ORDER — ACETAMINOPHEN 500 MG PO TABS
ORAL_TABLET | ORAL | Status: AC
  Filled 2020-11-20: qty 2

## 2020-11-20 MED ORDER — LACTATED RINGERS IV SOLN: INTRAVENOUS | Status: DC

## 2020-11-20 MED ORDER — SODIUM CHLORIDE 0.9 % IR SOLN
Status: DC | PRN
  Administered 2020-11-20: 1000 mL

## 2020-11-20 MED ORDER — ACETAMINOPHEN 500 MG PO TABS
1000.0000 mg | ORAL_TABLET | Freq: Once | ORAL | Status: AC
  Administered 2020-11-20: 1000 mg via ORAL

## 2020-11-20 MED ORDER — ORAL CARE MOUTH RINSE: 15.0000 mL | Freq: Once | OROMUCOSAL | Status: DC

## 2020-11-20 MED ORDER — PROPOFOL 1000 MG/100ML IV EMUL
INTRAVENOUS | Status: AC
  Filled 2020-11-20: qty 100

## 2020-11-20 MED ORDER — ONDANSETRON HCL 4 MG/2ML IJ SOLN
INTRAMUSCULAR | Status: DC | PRN
  Administered 2020-11-20: 4 mg via INTRAVENOUS

## 2020-11-20 MED ORDER — PROPOFOL 10 MG/ML IV BOLUS
INTRAVENOUS | Status: AC
  Filled 2020-11-20: qty 20

## 2020-11-20 MED ORDER — MIDAZOLAM HCL 2 MG/2ML IJ SOLN
2.0000 mg | Freq: Once | INTRAMUSCULAR | Status: AC
  Administered 2020-11-20: 2 mg via INTRAVENOUS

## 2020-11-20 MED ORDER — FENTANYL CITRATE (PF) 100 MCG/2ML IJ SOLN
100.0000 ug | Freq: Once | INTRAMUSCULAR | Status: AC
  Administered 2020-11-20: 100 ug via INTRAVENOUS

## 2020-11-20 MED ORDER — CHLORHEXIDINE GLUCONATE 0.12 % MT SOLN: 15.0000 mL | Freq: Once | OROMUCOSAL | Status: DC

## 2020-11-20 MED ORDER — FENTANYL CITRATE (PF) 250 MCG/5ML IJ SOLN
INTRAMUSCULAR | Status: DC | PRN
  Administered 2020-11-20: 50 ug via INTRAVENOUS

## 2020-11-20 MED ORDER — ROPIVACAINE HCL 5 MG/ML IJ SOLN
INTRAMUSCULAR | Status: DC | PRN
  Administered 2020-11-20: 30 mL via PERINEURAL

## 2020-11-20 MED ORDER — MIDAZOLAM HCL 2 MG/2ML IJ SOLN
INTRAMUSCULAR | Status: AC
  Filled 2020-11-20: qty 2

## 2020-11-20 MED ORDER — CLONIDINE HCL (ANALGESIA) 100 MCG/ML EP SOLN
EPIDURAL | Status: DC | PRN
  Administered 2020-11-20: 50 ug

## 2020-11-20 MED ORDER — PROPOFOL 10 MG/ML IV BOLUS
INTRAVENOUS | Status: DC | PRN
  Administered 2020-11-20: 20 mg via INTRAVENOUS

## 2020-11-20 MED ORDER — CEFAZOLIN SODIUM-DEXTROSE 2-4 GM/100ML-% IV SOLN
2.0000 g | INTRAVENOUS | Status: AC
  Administered 2020-11-20: 2 g via INTRAVENOUS

## 2020-11-20 MED ORDER — FENTANYL CITRATE (PF) 100 MCG/2ML IJ SOLN: 25.0000 ug | INTRAMUSCULAR | Status: DC | PRN

## 2020-11-20 MED ORDER — ONDANSETRON HCL 4 MG/2ML IJ SOLN
INTRAMUSCULAR | Status: AC
  Filled 2020-11-20: qty 2

## 2020-11-20 MED ORDER — CEFAZOLIN SODIUM-DEXTROSE 2-4 GM/100ML-% IV SOLN
INTRAVENOUS | Status: AC
  Filled 2020-11-20: qty 100

## 2020-11-20 MED ORDER — PROPOFOL 500 MG/50ML IV EMUL
INTRAVENOUS | Status: DC | PRN
  Administered 2020-11-20: 100 ug/kg/min via INTRAVENOUS

## 2020-11-20 MED ORDER — ONDANSETRON HCL 4 MG/2ML IJ SOLN: 4.0000 mg | Freq: Once | INTRAMUSCULAR | Status: DC | PRN

## 2020-11-20 SURGICAL SUPPLY — 83 items
BAG COUNTER SPONGE SURGICOUNT (BAG) IMPLANT
BAG SPEC THK2 15X12 ZIP CLS (MISCELLANEOUS) ×1
BAG SPNG CNTER NS LX DISP (BAG)
BAG SURGICOUNT SPONGE COUNTING (BAG)
BAG ZIPLOCK 12X15 (MISCELLANEOUS) ×3 IMPLANT
BIT DRILL 2.2 SS TIBIAL (BIT) ×3 IMPLANT
BLADE SURG 15 STRL LF DISP TIS (BLADE) ×2 IMPLANT
BLADE SURG 15 STRL SS (BLADE) ×6
BNDG CMPR 9X4 STRL LF SNTH (GAUZE/BANDAGES/DRESSINGS) ×1
BNDG ELASTIC 4X5.8 VLCR STR LF (GAUZE/BANDAGES/DRESSINGS) ×3 IMPLANT
BNDG ESMARK 4X9 LF (GAUZE/BANDAGES/DRESSINGS) ×3 IMPLANT
BNDG GAUZE ELAST 4 BULKY (GAUZE/BANDAGES/DRESSINGS) ×3 IMPLANT
BNDG PLASTER X FAST 3X3 WHT LF (CAST SUPPLIES) ×3 IMPLANT
BNDG PLSTR 9X3 FST ST WHT (CAST SUPPLIES) ×1
CORD BIPOLAR FORCEPS 12FT (ELECTRODE) ×3 IMPLANT
COVER BACK TABLE 60X90IN (DRAPES) ×3 IMPLANT
COVER SURGICAL LIGHT HANDLE (MISCELLANEOUS) ×3 IMPLANT
CUFF TOURN SGL QUICK 18X4 (TOURNIQUET CUFF) ×3 IMPLANT
DECANTER SPIKE VIAL GLASS SM (MISCELLANEOUS) ×3 IMPLANT
DRAPE OEC MINIVIEW 54X84 (DRAPES) ×3 IMPLANT
DRAPE U-SHAPE 47X51 STRL (DRAPES) ×3 IMPLANT
DRSG PAD ABDOMINAL 8X10 ST (GAUZE/BANDAGES/DRESSINGS) ×3 IMPLANT
ELECT REM PT RETURN 15FT ADLT (MISCELLANEOUS) ×3 IMPLANT
EVACUATOR 1/8 PVC DRAIN (DRAIN) IMPLANT
GAUZE 4X4 16PLY ~~LOC~~+RFID DBL (SPONGE) ×3 IMPLANT
GAUZE SPONGE 4X4 12PLY STRL (GAUZE/BANDAGES/DRESSINGS) ×3 IMPLANT
GAUZE SPONGE 4X4 12PLY STRL LF (GAUZE/BANDAGES/DRESSINGS) ×3 IMPLANT
GLOVE SURG ENC MOIS LTX SZ7 (GLOVE) ×3 IMPLANT
GLOVE SURG UNDER LTX SZ7.5 (GLOVE) ×3 IMPLANT
GOWN STRL REUS W/TWL LRG LVL3 (GOWN DISPOSABLE) ×3 IMPLANT
K-WIRE 1.6 (WIRE) ×9
K-WIRE DBL TROCAR .062X4 (WIRE)
K-WIRE FX5X1.6XNS BN SS (WIRE) ×3
KIT BASIN OR (CUSTOM PROCEDURE TRAY) ×3 IMPLANT
KIT TURNOVER KIT A (KITS) IMPLANT
KWIRE DBL TROCAR .062X4 (WIRE) IMPLANT
KWIRE FX5X1.6XNS BN SS (WIRE) ×3 IMPLANT
MANIFOLD NEPTUNE II (INSTRUMENTS) ×3 IMPLANT
NS IRRIG 1000ML POUR BTL (IV SOLUTION) ×3 IMPLANT
PACK ORTHO EXTREMITY (CUSTOM PROCEDURE TRAY) ×3 IMPLANT
PAD CAST 3X4 CTTN HI CHSV (CAST SUPPLIES) ×1 IMPLANT
PAD CAST 4YDX4 CTTN HI CHSV (CAST SUPPLIES) ×1 IMPLANT
PADDING CAST COTTON 3X4 STRL (CAST SUPPLIES) ×3
PADDING CAST COTTON 4X4 STRL (CAST SUPPLIES) ×3
PEG LOCKING SMOOTH 2.2X18 (Peg) ×9 IMPLANT
PEG LOCKING SMOOTH 2.2X20 (Screw) ×6 IMPLANT
PENCIL SMOKE EVACUATOR (MISCELLANEOUS) ×3 IMPLANT
PLATE NARROW DVR RIGHT (Plate) ×3 IMPLANT
PROTECTOR NERVE ULNAR (MISCELLANEOUS) ×3 IMPLANT
SCREW  LP NL 2.7X15MM (Screw) ×2 IMPLANT
SCREW 2.7X12MM (Screw) ×3 IMPLANT
SCREW 2.7X14MM (Screw) ×4 IMPLANT
SCREW BN 14X2.7XNONLOCK 3 LD (Screw) ×2 IMPLANT
SCREW LOCK 12X2.7X 3 LD (Screw) ×1 IMPLANT
SCREW LOCK 14X2.7X 3 LD TPR (Screw) ×1 IMPLANT
SCREW LOCK 20X2.7X 3 LD TPR (Screw) ×1 IMPLANT
SCREW LOCKING 2.7X12MM (Screw) ×3 IMPLANT
SCREW LOCKING 2.7X14 (Screw) ×3 IMPLANT
SCREW LOCKING 2.7X20MM (Screw) ×3 IMPLANT
SCREW LP NL 2.7X15MM (Screw) ×1 IMPLANT
SCREW NLOCK 2.7X14 (Screw) ×2 IMPLANT
SPONGE T-LAP 4X18 ~~LOC~~+RFID (SPONGE) ×3 IMPLANT
SUCTION FRAZIER HANDLE 10FR (MISCELLANEOUS) ×2
SUCTION TUBE FRAZIER 10FR DISP (MISCELLANEOUS) ×1 IMPLANT
SUT BONE WAX W31G (SUTURE) ×3 IMPLANT
SUT ETHILON 4 0 PS 2 18 (SUTURE) ×3 IMPLANT
SUT ETHILON 6 0 PS 3 18 (SUTURE) IMPLANT
SUT MERSILENE 4 0 P 3 (SUTURE) IMPLANT
SUT MNCRL AB 4-0 PS2 18 (SUTURE) ×3 IMPLANT
SUT PROLENE 3 0 PS 2 (SUTURE) IMPLANT
SUT PROLENE 4 0 P 3 18 (SUTURE) IMPLANT
SUT PROLENE 4 0 RB 1 (SUTURE)
SUT PROLENE 4-0 RB1 .5 CRCL 36 (SUTURE) IMPLANT
SUT VIC AB 0 CT1 27 (SUTURE)
SUT VIC AB 0 CT1 27XBRD ANTBC (SUTURE) IMPLANT
SUT VIC AB 2-0 CT1 27 (SUTURE)
SUT VIC AB 2-0 CT1 TAPERPNT 27 (SUTURE) IMPLANT
SYR BULB EAR ULCER 2OZ BL STRL (SYRINGE) ×3 IMPLANT
TOWEL OR 17X26 10 PK STRL BLUE (TOWEL DISPOSABLE) ×3 IMPLANT
TUBE CONNECTING 12'X1/4 (SUCTIONS) ×1
TUBE CONNECTING 12X1/4 (SUCTIONS) ×2 IMPLANT
UNDERPAD 30X36 HEAVY ABSORB (UNDERPADS AND DIAPERS) ×3 IMPLANT
WATER STERILE IRR 1000ML POUR (IV SOLUTION) ×3 IMPLANT

## 2020-11-20 NOTE — Transfer of Care (Signed)
Immediate Anesthesia Transfer of Care Note  Patient: Debra Mueller  Procedure(s) Performed: OPEN REDUCTION INTERNAL FIXATION (ORIF) WRIST FRACTURE (Right: Wrist)  Patient Location: PACU  Anesthesia Type:General and Regional  Level of Consciousness: awake, alert , oriented and patient cooperative  Airway & Oxygen Therapy: Patient Spontanous Breathing  Post-op Assessment: Report given to RN and Post -op Vital signs reviewed and stable  Post vital signs: Reviewed and stable  Last Vitals:  Vitals Value Taken Time  BP 118/73 11/20/20 1238  Temp    Pulse 61 11/20/20 1239  Resp 14 11/20/20 1239  SpO2 93 % 11/20/20 1239  Vitals shown include unvalidated device data.  Last Pain:  Vitals:   11/20/20 0935  TempSrc:   PainSc: 0-No pain      Patients Stated Pain Goal: 5 (61/51/83 4373)  Complications: No notable events documented.

## 2020-11-20 NOTE — Anesthesia Postprocedure Evaluation (Signed)
Anesthesia Post Note  Patient: Debra Mueller  Procedure(s) Performed: OPEN REDUCTION INTERNAL FIXATION (ORIF) WRIST FRACTURE (Right: Wrist)     Patient location during evaluation: PACU Anesthesia Type: Regional Level of consciousness: awake and alert, oriented and awake Pain management: pain level controlled Vital Signs Assessment: post-procedure vital signs reviewed and stable Respiratory status: spontaneous breathing, nonlabored ventilation and respiratory function stable Cardiovascular status: stable and blood pressure returned to baseline Postop Assessment: no apparent nausea or vomiting Anesthetic complications: no   No notable events documented.  Last Vitals:  Vitals:   11/20/20 1300 11/20/20 1355  BP: 138/76 (!) 146/75  Pulse: (!) 58 (!) 51  Resp: 17 14  Temp:  36.6 C  SpO2: 93% 96%    Last Pain:  Vitals:   11/20/20 1355  TempSrc: Oral  PainSc: 0-No pain                 Catalina Gravel

## 2020-11-20 NOTE — Op Note (Signed)
OPERATIVE NOTE  DATE OF PROCEDURE: 11/20/2020  SURGEONS: Primary: [name], MD  PREOPERATIVE DIAGNOSIS: Right wrist fracture  POSTOPERATIVE DIAGNOSIS: Same  NAME OF PROCEDURE:    Right distal radius open reduction internal fixation, 3 fragments 2.    Right wrist brachioradialis tenotomy  3.    Right wrist radiographs four views with intraoperative interpretation  ANESTHESIA: Regional Block + MAC  SKIN PREPARATION: Hibiclens  ESTIMATED BLOOD LOSS: Minimal  IMPLANTS: Biomet DVR Crosslock volar plate and screws  Implant Name Type Inv. Item Serial No. Manufacturer Lot No. LRB No. Used Action  PLATE NARROW DVR RIGHT - [id number] Plate PLATE NARROW DVR RIGHT  [employer]ON(ORTH,TRAU,BIO,SG)  Right 1 Implanted  SCREW LOCKING 2.7X20MM - [id number] Screw SCREW LOCKING 2.7X20MM  [employer](ORTH,TRAU,BIO,SG)  Right 1 Implanted  PEG LOCKING SMOOTH 2.2X18 - [id number] Peg PEG LOCKING SMOOTH 2.2X18  [employer](ORTH,TRAU,BIO,SG)  Right 3 Implanted  PEG LOCKING SMOOTH 2.2X20 - [id number] Screw PEG LOCKING SMOOTH 2.2X20  [employer](ORTH,TRAU,BIO,SG)  Right 2 Implanted  SCREW LOCKING 2.7X12MM - [id number] Screw SCREW LOCKING 2.7X12MM  ZIMMER [employer]ON(ORTH,TRAU,BIO,SG)  Right 1 Implanted  SCREW LOCKING 2.7X14 - [id number] Screw SCREW LOCKING 2.7X14  ZIMMER [employer]ON(ORTH,TRAU,BIO,SG)  Right 1 Implanted  SCREW 2.7X12MM - [id number] Screw SCREW 2.7X12MM  [employer]ON(ORTH,TRAU,BIO,SG)  Right 1 Implanted    INDICATIONS:  Debra Mueller is a 65 y.o. female who has the above preoperative diagnosis. The patient has decided to proceed with surgical intervention.  Risks, benefits and alternatives of operative management were discussed including, but not limited to, risks of anesthesia complications, infection, pain, persistent symptoms, stiffness, need for future surgery.  The patient understands, agrees and elects to proceed with surgery.    DESCRIPTION OF PROCEDURE: The patient was met in the pre-operative area and  their identity was verified.  The operative location and laterality was also verified and marked.  The patient was brought to the OR and was placed supine on the table.  After repeat patient identification with the operative team anesthesia was provided and the patient was prepped and draped in the usual sterile fashion.  A final timeout was performed verifying the correction patient, procedure, location and laterality.  Preoperative antibiotics were administered. The right upper extremity was exsanguinated with an Esmarch and tourniquet inflated to 214mHg. Under loupe magnification, an incision was made directly over the flexor carpi radialis (FCR) tendon. Bipolar was utilized for hemostasis. The roof of the FCR tendon sheath was incised. The FCR tendon was then retracted ulnarly to protect the palmar cutaneous branch of the median nerve. Subsequently, the floor of the FCR tendon sheath was incised over the distal end of the radius.  The flexor pollicis longus (FPL) was swept ulnarly to reveal the pronator quadratus.  The pronator quadratus fascia was incised from its distal and radial borders. A periosteal elevator was utilized to mobilize the pronator quadratus muscle off the distal radius.  The fracture site was irrigated and prepared for reduction with a freer and adson forceps. The brachioradialis tendon insertion was released to facilitate reduction.  This release was performed by identifying the broad insertion of the brachiradialis tendon and also identifying the 1st dorsal compartment tendons.  The 1st dorsal compartment tendons were protected, the broad tendon insertion was released under direct visualization. The fracture was reduced and provisionally fixed with K-wires. We then selected a proper length and width volar plate.  The plate was placed on the distal end of the radius with the fracture reduced and secured to the bone. Using  mini C-arm the fracture reduction and position of the plate were  deemed to be satisfactory.  We proceeded with securing the plate to the radius with the 1 bicortical nonlocking screw in the oblong portion of the shaft.  With the intermediate column reduced, we secured the distal end of the plate with 2 screws in the distal ulnar portion of plate.  We again used the C-arm to verify satisfactory plate position as well as fracture reduction. The radial column was then reduced and radial styloid locking screws were placed. The remainder shaft screws were placed through the plate. We used the mini C-arm to verify satisfactory plate position, screw lengths and fracture reduction. The DRUJ was then tested in neutral, pronation and supination and was found to be stable.The tourniquet was deflated. Meticulous hemostasis was obtained. The incision was copiously irrigated with normal saline and closed with interrupted 4-0 nylon horizontal mattress sutures. The incision was covered with xeroform, sterile guaze, webril and well padded short arm splint. The fingers were pink, warm and well perfused. All counts were correct. The patient was awoken from anesthesia and brought to PACU for recovery in stable condition.   Matt Holmes, MD Orthopaedic Hand Surgery

## 2020-11-20 NOTE — Anesthesia Preprocedure Evaluation (Addendum)
Anesthesia Evaluation  Patient identified by MRN, date of birth, ID band Patient awake    Reviewed: Allergy & Precautions, NPO status , Patient's Chart, lab work & pertinent test results  Airway Mallampati: II  TM Distance: >3 FB Neck ROM: Full    Dental  (+) Teeth Intact, Dental Advisory Given   Pulmonary former smoker,    Pulmonary exam normal breath sounds clear to auscultation       Cardiovascular hypertension, Pt. on home beta blockers Normal cardiovascular exam Rhythm:Regular Rate:Normal     Neuro/Psych PSYCHIATRIC DISORDERS Anxiety negative neurological ROS     GI/Hepatic negative GI ROS, Neg liver ROS, Celiac disease    Endo/Other  negative endocrine ROS  Renal/GU negative Renal ROS     Musculoskeletal Right wrist fracture   Abdominal   Peds  Hematology negative hematology ROS (+)   Anesthesia Other Findings   Reproductive/Obstetrics                            Anesthesia Physical Anesthesia Plan  ASA: 2  Anesthesia Plan: Regional and MAC   Post-op Pain Management:    Induction: Intravenous  PONV Risk Score and Plan: 2 and TIVA, Midazolam, Dexamethasone and Ondansetron  Airway Management Planned: Nasal Cannula and Natural Airway  Additional Equipment:   Intra-op Plan:   Post-operative Plan:   Informed Consent: I have reviewed the patients History and Physical, chart, labs and discussed the procedure including the risks, benefits and alternatives for the proposed anesthesia with the patient or authorized representative who has indicated his/her understanding and acceptance.     Dental advisory given  Plan Discussed with: CRNA  Anesthesia Plan Comments:        Anesthesia Quick Evaluation

## 2020-11-20 NOTE — Anesthesia Procedure Notes (Signed)
Anesthesia Regional Block: Supraclavicular block   Pre-Anesthetic Checklist: , timeout performed,  Correct Patient, Correct Site, Correct Laterality,  Correct Procedure, Correct Position, site marked,  Risks and benefits discussed,  Surgical consent,  Pre-op evaluation,  At surgeon's request and post-op pain management  Laterality: Right  Prep: chloraprep       Needles:  Injection technique: Single-shot  Needle Type: Echogenic Needle     Needle Length: 9cm  Needle Gauge: 21     Additional Needles:   Procedures:,,,, ultrasound used (permanent image in chart),,    Narrative:  Start time: 11/20/2020 9:15 AM End time: 11/20/2020 9:21 AM Injection made incrementally with aspirations every 5 mL.  Performed by: Personally  Anesthesiologist: Catalina Gravel, MD  Additional Notes: No pain on injection. No increased resistance to injection. Injection made in 5cc increments.  Good needle visualization.  Patient tolerated procedure well.

## 2020-11-20 NOTE — Interval H&P Note (Signed)
History and Physical Interval Note:  11/20/2020 8:55 AM  Debra Mueller  has presented today for surgery, with the diagnosis of Right wrist fracture.  The various methods of treatment have been discussed with the patient and family. After consideration of risks, benefits and other options for treatment, the patient has consented to  Procedure(s) with comments: OPEN REDUCTION INTERNAL FIXATION (ORIF) WRIST FRACTURE (Right) - with IV sedation needs 90 minutes as a surgical intervention.  The patient's history has been reviewed, patient examined, no change in status, stable for surgery.  I have reviewed the patient's chart and labs.  Questions were answered to the patient's satisfaction.     [name]

## 2020-11-20 NOTE — Discharge Instructions (Addendum)
Post Anesthesia Home Care Instructions  Activity: Get plenty of rest for the remainder of the day. A responsible individual must stay with you for 24 hours following the procedure.  For the next 24 hours, DO NOT: -Drive a car -Paediatric nurse -Drink alcoholic beverages -Take any medication unless instructed by your physician -Make any legal decisions or sign important papers.  Meals: Start with liquid foods such as gelatin or soup. Progress to regular foods as tolerated. Avoid greasy, spicy, heavy foods. If nausea and/or vomiting occur, drink only clear liquids until the nausea and/or vomiting subsides. Call your physician if vomiting continues.  Special Instructions/Symptoms: Your throat may feel dry or sore from the anesthesia or the breathing tube placed in your throat during surgery. If this causes discomfort, gargle with warm salt water. The discomfort should disappear within 24 hours.  If you had a scopolamine patch placed behind your ear for the management of post- operative nausea and/or vomiting:  1. The medication in the patch is effective for 72 hours, after which it should be removed.  Wrap patch in a tissue and discard in the trash. Wash hands thoroughly with soap and water. 2. You may remove the patch earlier than 72 hours if you experience unpleasant side effects which may include dry mouth, dizziness or visual disturbances. 3. Avoid touching the patch. Wash your hands with soap and water after contact with the patch.     Orthopaedic Hand Surgery Discharge Instructions  WEIGHT BEARING STATUS: Non weight bearing on operative extremity  DRESSINGS: Please keep your dressing/splint/cast clean and dry until your follow-up appointment. You may shower by placing a waterproof covering over your dressing/splint/cast. Contact your surgeon if your splint/cast gets wet. It will need to be changed to prevent skin breakdown.  PAIN CONTROL: First line medications for post operative  pain control are Tylenol (acetaminophen) and Motrin (ibuprofen) if you are able to take these medications. If you have been prescribed a medication these can be taken as breakthrough pain medications. Please note that some narcotic pain medication have acetaminophen added and you should never consume more than 4,000mg  of acetaminophen in 24 hour period. Also please note that if you are given Toradol (ketoralac) you should not take similar medications simultaneously such as ibuprofen.   ICE/ELEVATION: Ice and elevate your injured extremity as needed. Avoid direct contact of ice with skin.  HOME MEDICATIONS: No changes have been made to your home medications.  FOLLOW UP: You will be called after surgery with an appointment date and time, however if you have not received a phone call within 3 days please call during regular office hours at [phone] to schedule a post operative appointment.  Please Seek Medical Attention if: Call MD for: pain or pressure in chest, jaw, arm, back, neck  Call MD for: temperature greater than 101 F for more than 24 hours  Call MD for: difficulty breathing Call MD for: Incision redness, bleeding, drainage  Call MD for: palpitations or feeling that the heart is racing  Call MD for: increased swelling in arm, leg, ankle, or abdomen  Call MD for: lightheadedness, dizziness, fainting Go to ED or call 911 if: chest pain does not go away after 3 nitroglycerin doses taken 5 min apart  Go to ED or call 911 for: any uncontrolled bleeding  Go to ED or call 911 if: unable to reach physician  Discharge Medications: Allergies as of 11/20/2020       Reactions   Gluten Meal Other (See Comments)  Celiac disease   Sulfa Antibiotics Other (See Comments)   Childhood reaction        Medication List     TAKE these medications    B-complex with vitamin C tablet Take 1 tablet by mouth in the morning.   CALCIUM 600+D3 PO Take 1 tablet by mouth in the morning.    ibuprofen 200 MG tablet Commonly known as: ADVIL Take 400-1,000 mg by mouth every 8 (eight) hours as needed (for pain.).   Lubricant Drops/Dual-Action 0.5-0.9 % ophthalmic solution Generic drug: carboxymethylcellul-glycerin Place 1-2 drops into both eyes 3 (three) times daily as needed for dry eyes.   mometasone 50 MCG/ACT nasal spray Commonly known as: NASONEX Place 2 sprays into the nose every evening.   montelukast 10 MG tablet Commonly known as: SINGULAIR Take 10 mg by mouth at bedtime.   nebivolol 5 MG tablet Commonly known as: BYSTOLIC Take 5 mg by mouth daily.   sertraline 25 MG tablet Commonly known as: ZOLOFT Take 25 mg by mouth daily.          Izell [place], MD Orthopaedic Hand Surgeon [facility] Office number: [phone] [address], [address] [place], [place] [address]   Regional Anesthesia Blocks  1. Numbness or the inability to move the "blocked" extremity may last from 3-48 hours after placement. The length of time depends on the medication injected and your individual response to the medication. If the numbness is not going away after 48 hours, call your surgeon.  2. The extremity that is blocked will need to be protected until the numbness is gone and the  Strength has returned. Because you cannot feel it, you will need to take extra care to avoid injury. Because it may be weak, you may have difficulty moving it or using it. You may not know what position it is in without looking at it while the block is in effect.  3. For blocks in the legs and feet, returning to weight bearing and walking needs to be done carefully. You will need to wait until the numbness is entirely gone and the strength has returned. You should be able to move your leg and foot normally before you try and bear weight or walk. You will need someone to be with you when you first try to ensure you do not fall and possibly risk injury.  4. Bruising and tenderness at the needle  site are common side effects and will resolve in a few days.  5. Persistent numbness or new problems with movement should be communicated to the surgeon or the [facility] [phone] [place] [phone]).

## 2020-11-20 NOTE — Progress Notes (Signed)
Assisted Dr. Turk with right, ultrasound guided, supraclavicular block. Side rails up, monitors on throughout procedure. See vital signs in flow sheet. Tolerated Procedure well. 

## 2020-11-21 ENCOUNTER — Encounter (HOSPITAL_BASED_OUTPATIENT_CLINIC_OR_DEPARTMENT_OTHER): Payer: Self-pay | Admitting: Orthopedic Surgery

## 2020-12-03 DIAGNOSIS — S52501A Unspecified fracture of the lower end of right radius, initial encounter for closed fracture: Secondary | ICD-10-CM | POA: Diagnosis not present

## 2020-12-30 DIAGNOSIS — I700 Atherosclerosis of aorta: Secondary | ICD-10-CM | POA: Diagnosis not present

## 2020-12-30 DIAGNOSIS — Z8249 Family history of ischemic heart disease and other diseases of the circulatory system: Secondary | ICD-10-CM | POA: Diagnosis not present

## 2020-12-30 DIAGNOSIS — Z136 Encounter for screening for cardiovascular disorders: Secondary | ICD-10-CM | POA: Diagnosis not present

## 2020-12-31 DIAGNOSIS — S52501A Unspecified fracture of the lower end of right radius, initial encounter for closed fracture: Secondary | ICD-10-CM | POA: Diagnosis not present

## 2021-01-02 DIAGNOSIS — M25631 Stiffness of right wrist, not elsewhere classified: Secondary | ICD-10-CM | POA: Diagnosis not present

## 2021-01-11 DIAGNOSIS — I700 Atherosclerosis of aorta: Secondary | ICD-10-CM | POA: Insufficient documentation

## 2021-01-11 HISTORY — DX: Atherosclerosis of aorta: I70.0

## 2021-01-15 DIAGNOSIS — M25631 Stiffness of right wrist, not elsewhere classified: Secondary | ICD-10-CM | POA: Diagnosis not present

## 2021-01-21 DIAGNOSIS — J40 Bronchitis, not specified as acute or chronic: Secondary | ICD-10-CM | POA: Diagnosis not present

## 2021-01-21 DIAGNOSIS — E7800 Pure hypercholesterolemia, unspecified: Secondary | ICD-10-CM | POA: Diagnosis not present

## 2021-01-21 DIAGNOSIS — L710 Perioral dermatitis: Secondary | ICD-10-CM | POA: Diagnosis not present

## 2021-01-21 DIAGNOSIS — Z6830 Body mass index (BMI) 30.0-30.9, adult: Secondary | ICD-10-CM | POA: Diagnosis not present

## 2021-01-22 DIAGNOSIS — M25631 Stiffness of right wrist, not elsewhere classified: Secondary | ICD-10-CM | POA: Diagnosis not present

## 2021-01-28 DIAGNOSIS — H43813 Vitreous degeneration, bilateral: Secondary | ICD-10-CM | POA: Diagnosis not present

## 2021-01-28 DIAGNOSIS — H04123 Dry eye syndrome of bilateral lacrimal glands: Secondary | ICD-10-CM | POA: Diagnosis not present

## 2021-01-28 DIAGNOSIS — H40013 Open angle with borderline findings, low risk, bilateral: Secondary | ICD-10-CM | POA: Diagnosis not present

## 2021-01-28 DIAGNOSIS — H26492 Other secondary cataract, left eye: Secondary | ICD-10-CM | POA: Diagnosis not present

## 2021-01-29 DIAGNOSIS — E2839 Other primary ovarian failure: Secondary | ICD-10-CM | POA: Diagnosis not present

## 2021-01-29 DIAGNOSIS — M8580 Other specified disorders of bone density and structure, unspecified site: Secondary | ICD-10-CM | POA: Diagnosis not present

## 2021-01-29 DIAGNOSIS — M85851 Other specified disorders of bone density and structure, right thigh: Secondary | ICD-10-CM | POA: Diagnosis not present

## 2021-01-29 DIAGNOSIS — M25631 Stiffness of right wrist, not elsewhere classified: Secondary | ICD-10-CM | POA: Diagnosis not present

## 2021-02-05 DIAGNOSIS — M25631 Stiffness of right wrist, not elsewhere classified: Secondary | ICD-10-CM | POA: Diagnosis not present

## 2021-02-11 DIAGNOSIS — S52501D Unspecified fracture of the lower end of right radius, subsequent encounter for closed fracture with routine healing: Secondary | ICD-10-CM | POA: Diagnosis not present

## 2021-02-12 DIAGNOSIS — Z1231 Encounter for screening mammogram for malignant neoplasm of breast: Secondary | ICD-10-CM | POA: Diagnosis not present

## 2021-03-02 DIAGNOSIS — I4891 Unspecified atrial fibrillation: Secondary | ICD-10-CM | POA: Diagnosis not present

## 2021-03-02 DIAGNOSIS — Z6830 Body mass index (BMI) 30.0-30.9, adult: Secondary | ICD-10-CM | POA: Diagnosis not present

## 2021-03-02 DIAGNOSIS — R000 Tachycardia, unspecified: Secondary | ICD-10-CM | POA: Diagnosis not present

## 2021-03-04 ENCOUNTER — Encounter: Payer: Self-pay | Admitting: Cardiology

## 2021-03-04 ENCOUNTER — Encounter: Payer: Self-pay | Admitting: *Deleted

## 2021-03-04 DIAGNOSIS — K900 Celiac disease: Secondary | ICD-10-CM | POA: Insufficient documentation

## 2021-03-16 ENCOUNTER — Encounter: Payer: Self-pay | Admitting: Cardiology

## 2021-03-16 ENCOUNTER — Other Ambulatory Visit: Payer: Self-pay

## 2021-03-16 ENCOUNTER — Ambulatory Visit: Payer: Medicare PPO | Admitting: Cardiology

## 2021-03-16 ENCOUNTER — Telehealth: Payer: Self-pay | Admitting: Cardiology

## 2021-03-16 VITALS — BP 166/90 | HR 56 | Ht 66.0 in | Wt 185.0 lb

## 2021-03-16 DIAGNOSIS — F1010 Alcohol abuse, uncomplicated: Secondary | ICD-10-CM

## 2021-03-16 DIAGNOSIS — I10 Essential (primary) hypertension: Secondary | ICD-10-CM | POA: Diagnosis not present

## 2021-03-16 DIAGNOSIS — I480 Paroxysmal atrial fibrillation: Secondary | ICD-10-CM

## 2021-03-16 DIAGNOSIS — E785 Hyperlipidemia, unspecified: Secondary | ICD-10-CM | POA: Diagnosis not present

## 2021-03-16 HISTORY — DX: Alcohol abuse, uncomplicated: F10.10

## 2021-03-16 HISTORY — DX: Hyperlipidemia, unspecified: E78.5

## 2021-03-16 HISTORY — DX: Paroxysmal atrial fibrillation: I48.0

## 2021-03-16 MED ORDER — APIXABAN 5 MG PO TABS: 5.0000 mg | ORAL_TABLET | Freq: Two times a day (BID) | ORAL | 3 refills | Status: DC

## 2021-03-16 MED ORDER — DILTIAZEM HCL 30 MG PO TABS: 30.0000 mg | ORAL_TABLET | Freq: Four times a day (QID) | ORAL | 2 refills | Status: DC | PRN

## 2021-03-16 NOTE — Progress Notes (Signed)
Cardiology Consultation:    Date:  03/16/2021   ID:  Debra Mueller, DOB Mar 24, 1955, MRN [id number]  PCP:  Ernestene Kiel, MD  Cardiologist:  Jenne Campus, MD   Referring MD: Haydee Salter, NP   Chief Complaint  Patient presents with   Atrial Fibrillation    Ongoing since 03/01/21     History of Present Illness:    Rianna Andringa is a 66 y.o. female who is being seen today for the evaluation of paroxysmal atrial fibrillation at the request of Haydee Salter, NP.  Past medical history significant for essential hypertension, dyslipidemia, she also got history of anxiety.  About 3 weeks ago she woke up in the morning with not feeling well she went to feed animals but was not doing well she was tired and exhausted eventually end up checking her blood pressure, blood pressure was normal however her heart rate was elevated.  Then she took her husband's Apple Watch put it done and recorded that she had atrial fibrillation.  Next day she went to primary care physician she was find to be in atrial fibrillation, she was put on Eliquis however never took it.  Following day everything subsided and she felt good since that time.  She never had situation like this before.  While she was having atrial fibrillation she felt dizzy did not have any chest pain tightness squeezing pressure burning chest there was no shortness of breath.  There is no sweating. Overall she is doing well.  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  She drinks few beers every single evening.  She snores however it looks like she does not stop breathing at night according to her husband who is in the room with her.  Her husband approximately fibrillation, he is on flecainide and he does have sleep apnea uses CPAP mask for it. She quit smoking many years ago. Drinks few beers every single day. Does not have family history of premature coronary artery disease  Past Medical History:  Diagnosis Date    Acute postoperative pain 11/18/2020   Anxiety disorder    Basal cell carcinoma    Capsulitis of metatarsophalangeal (MTP) joint of right foot 12/30/2016   Celiac disease    Chronic seasonal allergic rhinitis 11/11/2015   Closed fracture of distal end of right radius 11/18/2020   Essential hypertension 11/11/2015   Hammer toe of second toe of right foot 12/30/2016   History of fractured kneecap    Right knee 6 weeks ago 11/19/20   Hypertension    Hypoglycemia 1994   in [place]   Hyposmolality    Mild aortic valve sclerosis    Seen on 12/2013 by Echo   RBBB (right bundle branch block)    Retinal hemorrhage     Past Surgical History:  Procedure Laterality Date   ANKLE FRACTURE SURGERY Left 2008   APPENDECTOMY  1975   CATARACT EXTRACTION Bilateral 02/2020   COLONOSCOPY  02/05/2008   Mild Sigmoid diverticulosis. Small internal hemorrhoids. Otherwise normal. Colon Redundant.   COLONOSCOPY  06/11/2013   Moderate sigmoid diverticulosis. otherwise normal. Bx: Prominent benign lymphoid aggregate, otherwise normal   ORIF WRIST FRACTURE Right 11/20/2020   Procedure: OPEN REDUCTION INTERNAL FIXATION (ORIF) WRIST FRACTURE;  Surgeon: [name], MD;  Location: [facility];  Service: Orthopedics;  Laterality: Right;  with IV sedation needs 90 minutes   TUBAL LIGATION  1997   UPPER GI ENDOSCOPY  02/16/2012   Scalloping  of dupdenal mucosa, suggestive of celiac disease. Small Bowel Bx: Findings consistent with Celiac Disease.    Current Medications: Current Meds  Medication Sig   apixaban (ELIQUIS) 5 MG TABS tablet Take 1 tablet (5 mg total) by mouth 2 (two) times daily.   b complex vitamins capsule Take 1 capsule by mouth daily.   metroNIDAZOLE (METROCREAM) 0.75 % cream Apply 1 application topically daily.   mometasone (NASONEX) 50 MCG/ACT nasal spray Place 2 sprays into the nose every evening.   montelukast (SINGULAIR) 10 MG tablet Take 10 mg by mouth at bedtime.    nebivolol (BYSTOLIC) 5 MG tablet Take 5 mg by mouth daily.   rosuvastatin (CRESTOR) 5 MG tablet Take 5 mg by mouth every 3 (three) days.   sertraline (ZOLOFT) 25 MG tablet Take 25 mg by mouth daily.   [DISCONTINUED] albuterol (VENTOLIN HFA) 108 (90 Base) MCG/ACT inhaler Inhale 1-2 puffs into the lungs every 4 (four) hours as needed for shortness of breath.   [DISCONTINUED] carboxymethylcellul-glycerin (LUBRICANT DROPS/DUAL-ACTION) 0.5-0.9 % ophthalmic solution Place 1-2 drops into both eyes 3 (three) times daily as needed for dry eyes.     Allergies:   Carvedilol, Gluten meal, Lisinopril, Losartan, and Sulfa antibiotics   Social History   Socioeconomic History   Marital status: Married    Spouse name: Not on file   Number of children: Not on file   Years of education: Not on file   Highest education level: Not on file  Occupational History   Not on file  Tobacco Use   Smoking status: Former    Types: Cigarettes    Quit date: 2000    Years since quitting: 23.1   Smokeless tobacco: Never  Vaping Use   Vaping Use: Never used  Substance and Sexual Activity   Alcohol use: Yes    Alcohol/week: 21.0 standard drinks    Types: 21 Cans of beer per week   Drug use: Never   Sexual activity: Not on file  Other Topics Concern   Not on file  Social History Narrative   Not on file   Social Determinants of Health   Financial Resource Strain: Not on file  Food Insecurity: Not on file  Transportation Needs: Not on file  Physical Activity: Not on file  Stress: Not on file  Social Connections: Not on file     Family History: The patient's family history includes AAA (abdominal aortic aneurysm) in her father; Atrial fibrillation in her father; Dementia in her mother; Diabetes in her paternal aunt and paternal grandfather; Glaucoma in her mother; Hypercholesterolemia in her mother and sister; Hypertension in her father; Prostate cancer in her maternal grandfather and maternal uncle; Stroke  in her paternal aunt. There is no history of Colon cancer. ROS:   Please see the history of present illness.    All 14 point review of systems negative except as described per history of present illness.  EKGs/Labs/Other Studies Reviewed:    The following studies were reviewed today: EKG done by primary care physician show atrial fibrillation controlled ventricular rate right bundle branch block  EKG:  EKG is  ordered today.  The ekg ordered today demonstrates normal sinus rhythm normal P interval right bundle branch block.  Recent Labs: 11/20/2020: BUN 14; Creatinine, Ser 0.60; Hemoglobin 14.3; Potassium 4.5; Sodium 135  Recent Lipid Panel No results found for: CHOL, TRIG, HDL, CHOLHDL, VLDL, LDLCALC, LDLDIRECT  Physical Exam:    VS:  BP (!) 166/90 (BP Location: Right Arm, Patient  Position: Sitting)    Pulse (!) 56    Ht '5\' 6"'$  (1.676 m)    Wt 185 lb (83.9 kg)    SpO2 97%    BMI 29.86 kg/m     Wt Readings from Last 3 Encounters:  03/16/21 185 lb (83.9 kg)  03/02/21 185 lb (83.9 kg)  11/20/20 180 lb 9.6 oz (81.9 kg)     GEN:  Well nourished, well developed in no acute distress HEENT: Normal NECK: No JVD; No carotid bruits LYMPHATICS: No lymphadenopathy CARDIAC: RRR, no murmurs, no rubs, no gallops RESPIRATORY:  Clear to auscultation without rales, wheezing or rhonchi  ABDOMEN: Soft, non-tender, non-distended MUSCULOSKELETAL:  No edema; No deformity  SKIN: Warm and dry NEUROLOGIC:  Alert and oriented x 3 PSYCHIATRIC:  Normal affect   ASSESSMENT:    1. Essential hypertension   2. Paroxysmal atrial fibrillation (HCC)   3. ETOH abuse   4. Dyslipidemia    PLAN:    In order of problems listed above:  Paroxysmal atrial fibrillation this is first documented episode of atrial fibrillation, her CHA2DS2-VASc equals 3.  I recommend continuation of Eliquis.  5 mg twice daily is her dose.  I gave her prescription for it.  In terms of etiology of this phenomenon.  I did review her  lab work test her TSH was normal.  I suspect alcohol drinking is contributing to it.  She snores But does not stop breathing at night but in the future we will consider doing sleep study.  I also told. Start taking Cardizem CD 30 mg as needed for palpitations and will send prescription for it.  She will be scheduled to have an echocardiogram done to assess for ventricle ejection fraction as well as biatrial size. EtOH abuse, she was told to quit. Dyslipidemia.  She is taking 5 mg Crestor 3 times a week.  In the future we will check her fasting lipid profile. Essential hypertension: Blood pressure seems to be well controlled today.   Medication Adjustments/Labs and Tests Ordered: Current medicines are reviewed at length with the patient today.  Concerns regarding medicines are outlined above.  Orders Placed This Encounter  Procedures   EKG 12-Lead   ECHOCARDIOGRAM COMPLETE   Meds ordered this encounter  Medications   apixaban (ELIQUIS) 5 MG TABS tablet    Sig: Take 1 tablet (5 mg total) by mouth 2 (two) times daily.    Dispense:  180 tablet    Refill:  3    Signed, Park Liter, MD, [facility]. 03/16/2021 11:57 AM    [place]

## 2021-03-16 NOTE — Telephone Encounter (Signed)
Patient was returning Richard's call. Per DPR Office can leave a detailed message on her cell phone  ?

## 2021-03-16 NOTE — Telephone Encounter (Signed)
Left message for patient to call back  

## 2021-03-16 NOTE — Progress Notes (Signed)
Kg   

## 2021-03-16 NOTE — Telephone Encounter (Signed)
Called patient and informed her of the cardizem prescription and explained how and when to take this medication. ?

## 2021-03-16 NOTE — Patient Instructions (Addendum)
Medication Instructions:  ?Your physician has recommended you make the following change in your medication:  ? ?START: Eliquis 5 mg twice daily ?START Cardizem 30 mg every 6 hours as needed ? ?*If you need a refill on your cardiac medications before your next appointment, please call your pharmacy* ? ? ?Lab Work: ?None ?If you have labs (blood work) drawn today and your tests are completely normal, you will receive your results only by: ?MyChart Message (if you have MyChart) OR ?A paper copy in the mail ?If you have any lab test that is abnormal or we need to change your treatment, we will call you to review the results. ? ? ?Testing/Procedures: ?Your physician has requested that you have an echocardiogram. Echocardiography is a painless test that uses sound waves to create images of your heart. It provides your doctor with information about the size and shape of your heart and how well your heart?s chambers and valves are working. This procedure takes approximately one hour. There are no restrictions for this procedure. ? ? ? ?Follow-Up: ?At [facility], you and your health needs are our priority.  As part of our continuing mission to provide you with exceptional heart care, we have created designated Provider Care Teams.  These Care Teams include your primary Cardiologist (physician) and Advanced Practice Providers (APPs -  Physician Assistants and Nurse Practitioners) who all work together to provide you with the care you need, when you need it. ? ?We recommend signing up for the patient portal called "MyChart".  Sign up information is provided on this After Visit Summary.  MyChart is used to connect with patients for Virtual Visits (Telemedicine).  Patients are able to view lab/test results, encounter notes, upcoming appointments, etc.  Non-urgent messages can be sent to your provider as well.   ?To learn more about what you can do with MyChart, go to [email or web address].   ? ?Your next appointment:   ?2  month(s) ? ?The format for your next appointment:   ?In Person ? ?Provider:   ?Jenne Campus, MD  ? ? ?Other Instructions ?None ? ?

## 2021-03-16 NOTE — Telephone Encounter (Signed)
Patient returning call to Delfino Lovett, RN - she has an appt at 2:30pm so please call after 3:30pm. ?

## 2021-03-26 ENCOUNTER — Other Ambulatory Visit: Payer: Medicare PPO

## 2021-03-27 ENCOUNTER — Other Ambulatory Visit: Payer: Self-pay

## 2021-03-27 ENCOUNTER — Ambulatory Visit (INDEPENDENT_AMBULATORY_CARE_PROVIDER_SITE_OTHER): Payer: Medicare PPO

## 2021-03-27 DIAGNOSIS — I10 Essential (primary) hypertension: Secondary | ICD-10-CM | POA: Diagnosis not present

## 2021-03-27 LAB — ECHOCARDIOGRAM COMPLETE
Area-P 1/2: 3.27 cm2
S' Lateral: 2.8 cm

## 2021-03-30 ENCOUNTER — Ambulatory Visit: Payer: Medicare PPO | Admitting: Cardiology

## 2021-04-07 ENCOUNTER — Telehealth: Payer: Self-pay | Admitting: Cardiology

## 2021-04-07 NOTE — Telephone Encounter (Signed)
Patient informed of results.  

## 2021-04-07 NOTE — Telephone Encounter (Signed)
?  Pt is returning call to get echo ?

## 2021-05-04 DIAGNOSIS — L821 Other seborrheic keratosis: Secondary | ICD-10-CM | POA: Diagnosis not present

## 2021-05-04 DIAGNOSIS — L570 Actinic keratosis: Secondary | ICD-10-CM | POA: Diagnosis not present

## 2021-05-04 DIAGNOSIS — Z85828 Personal history of other malignant neoplasm of skin: Secondary | ICD-10-CM | POA: Diagnosis not present

## 2021-05-04 DIAGNOSIS — D692 Other nonthrombocytopenic purpura: Secondary | ICD-10-CM | POA: Diagnosis not present

## 2021-05-04 DIAGNOSIS — L918 Other hypertrophic disorders of the skin: Secondary | ICD-10-CM | POA: Diagnosis not present

## 2021-05-04 DIAGNOSIS — L718 Other rosacea: Secondary | ICD-10-CM | POA: Diagnosis not present

## 2021-05-04 DIAGNOSIS — K130 Diseases of lips: Secondary | ICD-10-CM | POA: Diagnosis not present

## 2021-05-21 ENCOUNTER — Ambulatory Visit: Payer: Medicare PPO | Admitting: Cardiology

## 2021-05-21 VITALS — BP 118/68 | HR 53 | Ht 66.0 in | Wt 180.2 lb

## 2021-05-21 DIAGNOSIS — E785 Hyperlipidemia, unspecified: Secondary | ICD-10-CM | POA: Diagnosis not present

## 2021-05-21 DIAGNOSIS — G4733 Obstructive sleep apnea (adult) (pediatric): Secondary | ICD-10-CM | POA: Insufficient documentation

## 2021-05-21 DIAGNOSIS — I10 Essential (primary) hypertension: Secondary | ICD-10-CM | POA: Diagnosis not present

## 2021-05-21 DIAGNOSIS — F1010 Alcohol abuse, uncomplicated: Secondary | ICD-10-CM | POA: Diagnosis not present

## 2021-05-21 DIAGNOSIS — I480 Paroxysmal atrial fibrillation: Secondary | ICD-10-CM

## 2021-05-21 HISTORY — DX: Obstructive sleep apnea (adult) (pediatric): G47.33

## 2021-05-21 NOTE — Progress Notes (Signed)
?Cardiology Office Note:   ? ?Date:  05/21/2021  ? ?ID:  Debra Mueller, DOB 1955/02/17, MRN [id number] ? ?PCP:  Ernestene Kiel, MD  ?Cardiologist:  Jenne Campus, MD   ? ?Referring MD: Ernestene Kiel, MD  ? ?Chief Complaint  ?Patient presents with  ? Follow-up  ?Doing very well ? ?History of Present Illness:   ? ?Debra Mueller is a 66 y.o. female past medical history significant for essential hypertension, EtOH abuse, dyslipidemia.  She was referred to [place] because of episode of atrial fibrillation.  She converted to sinus rhythm spontaneously, she was put on anticoagulation.  We did echocardiogram which showed preserved ejection fraction both atria are normal in size.  Suspicion is that either alcohol or obstructive sleep apnea is what triggered her atrial fibrillation episode.  Since the time I seen her last time there is no palpitation no chest pain tightness squeezing pressure mid chest. ? ?Past Medical History:  ?Diagnosis Date  ? Acute postoperative pain 11/18/2020  ? Anxiety disorder   ? Basal cell carcinoma   ? Capsulitis of metatarsophalangeal (MTP) joint of right foot 12/30/2016  ? Celiac disease   ? Chronic seasonal allergic rhinitis 11/11/2015  ? Closed fracture of distal end of right radius 11/18/2020  ? Essential hypertension 11/11/2015  ? Hammer toe of second toe of right foot 12/30/2016  ? History of fractured kneecap   ? Right knee 6 weeks ago 11/19/20  ? Hypertension   ? Hypoglycemia 1994  ? in [place]  ? Hyposmolality   ? Mild aortic valve sclerosis   ? Seen on 12/2013 by Echo  ? RBBB (right bundle branch block)   ? Retinal hemorrhage   ? ? ?Past Surgical History:  ?Procedure Laterality Date  ? ANKLE FRACTURE SURGERY Left 2008  ? APPENDECTOMY  1975  ? CATARACT EXTRACTION Bilateral 02/2020  ? COLONOSCOPY  02/05/2008  ? Mild Sigmoid diverticulosis. Small internal hemorrhoids. Otherwise normal. Colon Redundant.  ? COLONOSCOPY  06/11/2013  ? Moderate sigmoid diverticulosis. otherwise normal. Bx:  Prominent benign lymphoid aggregate, otherwise normal  ? ORIF WRIST FRACTURE Right 11/20/2020  ? Procedure: OPEN REDUCTION INTERNAL FIXATION (ORIF) WRIST FRACTURE;  Surgeon: [name], MD;  Location: [facility];  Service: Orthopedics;  Laterality: Right;  with IV sedation needs 90 minutes  ? TUBAL LIGATION  1997  ? UPPER GI ENDOSCOPY  02/16/2012  ? Scalloping of dupdenal mucosa, suggestive of celiac disease. Small Bowel Bx: Findings consistent with Celiac Disease.  ? ? ?Current Medications: ?Current Meds  ?Medication Sig  ? apixaban (ELIQUIS) 5 MG TABS tablet Take 1 tablet (5 mg total) by mouth 2 (two) times daily.  ? Calcium Carb-Cholecalciferol (CALCIUM 500 + D3 PO) Take 1 tablet by mouth daily.  ? Coenzyme Q10-Fish Oil-Vit E (CO-Q 10 OMEGA-3 FISH OIL PO) Take 1 capsule by mouth 2 (two) times daily.  ? diltiazem (CARDIZEM) 30 MG tablet Take 1 tablet (30 mg total) by mouth every 6 (six) hours as needed. (Patient taking differently: Take 30 mg by mouth every 6 (six) hours as needed (Afib).)  ? metroNIDAZOLE (METROCREAM) 0.75 % cream Apply 1 application topically daily.  ? mometasone (NASONEX) 50 MCG/ACT nasal spray Place 2 sprays into the nose every evening.  ? montelukast (SINGULAIR) 10 MG tablet Take 10 mg by mouth at bedtime.  ? nebivolol (BYSTOLIC) 5 MG tablet Take 5 mg by mouth daily.  ? rosuvastatin (CRESTOR) 5 MG tablet Take 5 mg by mouth every 3 (three) days.  ?  sertraline (ZOLOFT) 25 MG tablet Take 25 mg by mouth daily.  ?  ? ?Allergies:   Carvedilol, Gluten meal, Lisinopril, Losartan, and Sulfa antibiotics  ? ?Social History  ? ?Socioeconomic History  ? Marital status: Married  ?  Spouse name: Not on file  ? Number of children: Not on file  ? Years of education: Not on file  ? Highest education level: Not on file  ?Occupational History  ? Not on file  ?Tobacco Use  ? Smoking status: Former  ?  Types: Cigarettes  ?  Quit date: 2000  ?  Years since quitting: 23.3  ? Smokeless tobacco:  Never  ?Vaping Use  ? Vaping Use: Never used  ?Substance and Sexual Activity  ? Alcohol use: Yes  ?  Alcohol/week: 21.0 standard drinks  ?  Types: 21 Cans of beer per week  ? Drug use: Never  ? Sexual activity: Not on file  ?Other Topics Concern  ? Not on file  ?Social History Narrative  ? Not on file  ? ?Social Determinants of Health  ? ?Financial Resource Strain: Not on file  ?Food Insecurity: Not on file  ?Transportation Needs: Not on file  ?Physical Activity: Not on file  ?Stress: Not on file  ?Social Connections: Not on file  ?  ? ?Family History: ?The patient's family history includes AAA (abdominal aortic aneurysm) in her father; Atrial fibrillation in her father; Dementia in her mother; Diabetes in her paternal aunt and paternal grandfather; Glaucoma in her mother; Hypercholesterolemia in her mother and sister; Hypertension in her father; Prostate cancer in her maternal grandfather and maternal uncle; Stroke in her paternal aunt. There is no history of Colon cancer. ?ROS:   ?Please see the history of present illness.    ?All 14 point review of systems negative except as described per history of present illness ? ?EKGs/Labs/Other Studies Reviewed:   ? ? ? ?Recent Labs: ?11/20/2020: BUN 14; Creatinine, Ser 0.60; Hemoglobin 14.3; Potassium 4.5; Sodium 135  ?Recent Lipid Panel ?No results found for: CHOL, TRIG, HDL, CHOLHDL, VLDL, LDLCALC, LDLDIRECT ? ?Physical Exam:   ? ?VS:  BP 118/68 (BP Location: Left Arm, Patient Position: Sitting)   Pulse (!) 53   Ht '5\' 6"'$  (1.676 m)   Wt 180 lb 3.2 oz (81.7 kg)   SpO2 99%   BMI 29.09 kg/m?    ? ?Wt Readings from Last 3 Encounters:  ?05/21/21 180 lb 3.2 oz (81.7 kg)  ?03/16/21 185 lb (83.9 kg)  ?03/02/21 185 lb (83.9 kg)  ?  ? ?GEN:  Well nourished, well developed in no acute distress ?HEENT: Normal ?NECK: No JVD; No carotid bruits ?LYMPHATICS: No lymphadenopathy ?CARDIAC: RRR, no murmurs, no rubs, no gallops ?RESPIRATORY:  Clear to auscultation without rales, wheezing  or rhonchi  ?ABDOMEN: Soft, non-tender, non-distended ?MUSCULOSKELETAL:  No edema; No deformity  ?SKIN: Warm and dry ?LOWER EXTREMITIES: no swelling ?NEUROLOGIC:  Alert and oriented x 3 ?PSYCHIATRIC:  Normal affect  ? ?ASSESSMENT:   ? ?1. Paroxysmal atrial fibrillation (HCC)   ?2. Essential hypertension   ?3. ETOH abuse   ?4. Dyslipidemia   ?5. Obstructive sleep apnea   ? ?PLAN:   ? ?In order of problems listed above: ? ?Paroxysmal atrial fibrillation we will do sleep study on her.  We will continue anticoagulation for now until we have clarification of the diagnosis.  Good news his echocardiogram showing normal atrial size. ?EtOH abuse which obviously contributing.  She significantly cut down alcohol and hopefully will  be able to discontinue.  She also signed up with a treatment start exercising on the regular basis she lost some weight which will be also very beneficial from atrial fibrillation point review ?Dyslipidemia I did review K PN which show her LDL 165 HDL 66.  She changed dramatically her wellbeing.  She takes Crestor which I will continue for now but will recheck her fasting lipid profile in few months to see how lifestyle modification affect her cholesterol. ?Obstructive sleep apnea which I strongly suspect.  She will be scheduled to have sleep study ? ? ?Medication Adjustments/Labs and Tests Ordered: ?Current medicines are reviewed at length with the patient today.  Concerns regarding medicines are outlined above.  ?No orders of the defined types were placed in this encounter. ? ?Medication changes: No orders of the defined types were placed in this encounter. ? ? ?Signed, ?Park Liter, MD, [facility] ?05/21/2021 11:30 AM    ?[place] ?

## 2021-05-21 NOTE — Addendum Note (Signed)
Addended by: Jacobo Forest D on: 05/21/2021 11:39 AM ? ? Modules accepted: Orders ? ?

## 2021-05-21 NOTE — Patient Instructions (Signed)
Medication Instructions:  ?Your physician recommends that you continue on your current medications as directed. Please refer to the Current Medication list given to you today.  ?*If you need a refill on your cardiac medications before your next appointment, please call your pharmacy* ? ? ?Lab Work: ?None Ordered ?If you have labs (blood work) drawn today and your tests are completely normal, you will receive your results only by: ?MyChart Message (if you have MyChart) OR ?A paper copy in the mail ?If you have any lab test that is abnormal or we need to change your treatment, we will call you to review the results. ? ? ?Testing/Procedures: ?None Ordered ? ? ?Follow-Up: ?At [facility], you and your health needs are our priority.  As part of our continuing mission to provide you with exceptional heart care, we have created designated Provider Care Teams.  These Care Teams include your primary Cardiologist (physician) and Advanced Practice Providers (APPs -  Physician Assistants and Nurse Practitioners) who all work together to provide you with the care you need, when you need it. ? ?We recommend signing up for the patient portal called "MyChart".  Sign up information is provided on this After Visit Summary.  MyChart is used to connect with patients for Virtual Visits (Telemedicine).  Patients are able to view lab/test results, encounter notes, upcoming appointments, etc.  Non-urgent messages can be sent to your provider as well.   ?To learn more about what you can do with MyChart, go to [email or web address].   ? ?Your next appointment:   ?2 month(s) ? ?The format for your next appointment:   ?In Person ? ?Provider:   ?Jenne Campus, MD  ? ? ?Other Instructions ?[facility]- home sleep study- will send to be pre-certified through insurance and will call to come pick up when we receive the ok. ?

## 2021-05-26 DIAGNOSIS — I10 Essential (primary) hypertension: Secondary | ICD-10-CM | POA: Diagnosis not present

## 2021-05-26 DIAGNOSIS — Z6829 Body mass index (BMI) 29.0-29.9, adult: Secondary | ICD-10-CM | POA: Diagnosis not present

## 2021-05-26 DIAGNOSIS — I480 Paroxysmal atrial fibrillation: Secondary | ICD-10-CM | POA: Diagnosis not present

## 2021-05-26 DIAGNOSIS — D6859 Other primary thrombophilia: Secondary | ICD-10-CM | POA: Diagnosis not present

## 2021-05-26 DIAGNOSIS — E785 Hyperlipidemia, unspecified: Secondary | ICD-10-CM | POA: Diagnosis not present

## 2021-06-01 ENCOUNTER — Telehealth: Payer: Self-pay

## 2021-06-01 ENCOUNTER — Telehealth: Payer: Self-pay | Admitting: Cardiology

## 2021-06-01 NOTE — Telephone Encounter (Signed)
Patient is returning [place] call to setup [place] appointment.

## 2021-06-01 NOTE — Telephone Encounter (Signed)
LM to return my call regarding [facility] scheduling.( Device approved )

## 2021-06-01 NOTE — Telephone Encounter (Signed)
Patient coming on 06/02/21 at 11am  Sleep Apnea Evaluation  [place] Date: 06/01/2021   Patient Name: Debra Mueller        DOB: May 28, 1955       Height:   5 6     Weight:  175 lb  BMI: There is no height or weight on file to calculate BMI.    Referring Provider:  Dr. Jenne Campus   STOP-BANG RISK ASSESSMENT         If STOP-BANG Score ?3 OR two clinical symptoms - patient qualifies for WatchPAT (CPT 95800)      Sleep study ordered due to two (2) of the following clinical symptoms/diagnoses:  Excessive daytime sleepiness G47.10  Gastroesophageal reflux K21.9  Nocturia R35.1  Morning Headaches G44.221  Difficulty concentrating R41.840  Memory problems or poor judgment G31.84  Personality changes or irritability R45.4  Loud snoring R06.83  Depression F32.9  Unrefreshed by sleep G47.8  Impotence N52.9  History of high blood pressure R03.0  Insomnia G47.00  Sleep Disordered Breathing or Sleep Apnea ICD G47.33

## 2021-06-03 ENCOUNTER — Encounter (INDEPENDENT_AMBULATORY_CARE_PROVIDER_SITE_OTHER): Payer: Medicare PPO | Admitting: Cardiology

## 2021-06-03 DIAGNOSIS — G4733 Obstructive sleep apnea (adult) (pediatric): Secondary | ICD-10-CM | POA: Diagnosis not present

## 2021-06-08 ENCOUNTER — Ambulatory Visit: Payer: Medicare PPO

## 2021-06-08 DIAGNOSIS — G4733 Obstructive sleep apnea (adult) (pediatric): Secondary | ICD-10-CM

## 2021-06-08 NOTE — Procedures (Signed)
   SLEEP STUDY REPORT  Patient Information Study Date: 06/03/21 Patient Name: Debra Mueller Patient ID: [id number] Birth Date: 12/29/2055 Age: 66 Gender: Female BMI: 29.1 (W=181 lb, H=5' 6'') Neck Circ.: 40 '' Referring Physician: Jenne Campus, MD  TEST DESCRIPTION: Home sleep apnea testing was completed using the WatchPat, a Type 1 device, utilizing  peripheral arterial tonometry (PAT), chest movement, actigraphy, pulse oximetry, pulse rate, body position and snore.  AHI was calculated with apnea and hypopnea using valid sleep time as the denominator. RDI includes apneas,  hypopneas, and RERAs. The data acquired and the scoring of sleep and all associated events were performed in  accordance with the recommended standards and specifications as outlined in the AASM Manual for the Scoring of  Sleep and Associated Events 2.2.0 (2015).   FINDINGS:  1. Mild Obstructive Sleep Apnea with AHI 7.2/hr.  2. No Central Sleep Apnea with pAHIc 1.3/hr.  3. Oxygen desaturations as low as 88%.  4. Moderate snoring was present. O2 sats were < 88% for 0.2 min.  5. Total sleep time was 7 hrs and 32 min.  6. 10.8% of total sleep time was spent in REM sleep.  7. Normal sleep onset latency at 19 min.  8. Prolonged REM sleep onset latency at 212 min.  9. Total awakenings were 5.   DIAGNOSIS: Mild Obstructive Sleep Apnea (G47.33)  RECOMMENDATIONS: 1. Clinical correlation of these findings is necessary. The decision to treat obstructive sleep apnea (OSA) is usually  based on the presence of apnea symptoms or the presence of associated medical conditions such as Hypertension,  Congestive Heart Failure, Atrial Fibrillation or Obesity. The most common symptoms of OSA are snoring, gasping for  breath while sleeping, daytime sleepiness and fatigue.   2. Initiating apnea therapy is recommended given the presence of symptoms and/or associated conditions.  Recommend proceeding with one of the  following:   a. Auto-CPAP therapy with a pressure range of 5-20cm H2O.   b. An oral appliance (OA) that can be obtained from certain dentists with expertise in sleep medicine. These are  primarily of use in non-obese patients with mild and moderate disease.   c. An ENT consultation which may be useful to look for specific causes of obstruction and possible treatment  options.   d. If patient is intolerant to PAP therapy, consider referral to ENT for evaluation for hypoglossal nerve stimulator.   3. Close follow-up is necessary to ensure success with CPAP or oral appliance therapy for maximum benefit .  4. A follow-up oximetry study on CPAP is recommended to assess the adequacy of therapy and determine the need  for supplemental oxygen or the potential need for Bi-level therapy. An arterial blood gas to determine the adequacy of  baseline ventilation and oxygenation should also be considered.  5. Healthy sleep recommendations include: adequate nightly sleep (normal 7-9 hrs/night), avoidance of caffeine after  noon and alcohol near bedtime, and maintaining a sleep environment that is cool, dark and quiet.  6. Weight loss for overweight patients is recommended. Even modest amounts of weight loss can significantly  improve the severity of sleep apnea.  7. Snoring recommendations include: weight loss where appropriate, side sleeping, and avoidance of alcohol before  bed.  8. Operation of motor vehicle should be avoided when sleepy.  Signature: Electronically Signed: 06/08/21 Fransico Him, MD; [facility]; [place], American Board of  Sleep Medicine

## 2021-06-12 ENCOUNTER — Telehealth: Payer: Self-pay | Admitting: *Deleted

## 2021-06-12 NOTE — Telephone Encounter (Signed)
Patient notified of sleep study results and MD recommendations. She requested that I ask Dr Radford Pax if she is a candidate for a oral device. Her husband uses a CPAP machine and she doesn't feel that she can use one. Message will be sent to Dr Radford Pax for review.

## 2021-06-12 NOTE — Telephone Encounter (Signed)
-----   Message from Sueanne Margarita, MD sent at 06/08/2021  1:40 PM EDT ----- Please let patient know that they have sleep apnea and recommend treating with CPAP.  Please order an auto CPAP from 4-15cm H2O with heated humidity and mask of choice.  Order overnight pulse ox on CPAP.  Followup with me in 6 weeks.

## 2021-06-22 NOTE — Telephone Encounter (Signed)
Patient notified per Dr Radford Pax ok to use oral device. Patient will check with her dentist first. She will call me back to inform me if I need to send order to Dr Toy Cookey.

## 2021-08-07 ENCOUNTER — Ambulatory Visit: Payer: Medicare PPO | Admitting: Cardiology

## 2021-08-13 ENCOUNTER — Ambulatory Visit: Payer: Medicare PPO | Admitting: Cardiology

## 2021-08-13 ENCOUNTER — Encounter: Payer: Self-pay | Admitting: Cardiology

## 2021-08-13 VITALS — BP 168/90 | HR 58 | Ht 66.0 in | Wt 181.2 lb

## 2021-08-13 DIAGNOSIS — G4733 Obstructive sleep apnea (adult) (pediatric): Secondary | ICD-10-CM | POA: Diagnosis not present

## 2021-08-13 DIAGNOSIS — I480 Paroxysmal atrial fibrillation: Secondary | ICD-10-CM | POA: Diagnosis not present

## 2021-08-13 DIAGNOSIS — I10 Essential (primary) hypertension: Secondary | ICD-10-CM

## 2021-08-13 DIAGNOSIS — E785 Hyperlipidemia, unspecified: Secondary | ICD-10-CM

## 2021-08-13 NOTE — Progress Notes (Signed)
Cardiology Office Note:    Date:  08/13/2021   ID:  Debra Mueller, DOB 11-11-1955, MRN [id number]  PCP:  Ernestene Kiel, MD  Cardiologist:  Jenne Campus, MD    Referring MD: Ernestene Kiel, MD   Chief Complaint  Patient presents with   Follow-up  Doing fine  History of Present Illness:    Debra Mueller is a 66 y.o. female with past medical history significant for essential hypertension, EtOH abuse, dyslipidemia, obstructive sleep apnea.  She was referred originally to [place] because of atrial fibrillation.  She converted to sinus rhythm and she is staying in sinus rhythm since then.  In the meantime she had a sleep study done which showed at least mild obstructive sleep apnea she was recommended CPAP she prefer not to use CPAP her husband use it and she does not think she can handle it she went to a orthodontist talk about potentially mouth devices however also does not want to do it she decided to try CPAP we will forward messages to our sleep study specialist to make arrangements for that.  Still continues to drink.  She said few days she did not drink at all but then usually after that had 2 beers before she goes to bed also when she goes to restaurants she usually drinks some liquor.  Denies have any chest pain tightness squeezing pressure burning chest no palpitations  Past Medical History:  Diagnosis Date   Acute postoperative pain 11/18/2020   Anxiety disorder    Basal cell carcinoma    Capsulitis of metatarsophalangeal (MTP) joint of right foot 12/30/2016   Celiac disease    Chronic seasonal allergic rhinitis 11/11/2015   Closed fracture of distal end of right radius 11/18/2020   Essential hypertension 11/11/2015   Hammer toe of second toe of right foot 12/30/2016   History of fractured kneecap    Right knee 6 weeks ago 11/19/20   Hypertension    Hypoglycemia 1994   in [place]   Hyposmolality    Mild aortic valve sclerosis    Seen on 12/2013 by Echo   RBBB (right  bundle branch block)    Retinal hemorrhage     Past Surgical History:  Procedure Laterality Date   ANKLE FRACTURE SURGERY Left 2008   APPENDECTOMY  1975   CATARACT EXTRACTION Bilateral 02/2020   COLONOSCOPY  02/05/2008   Mild Sigmoid diverticulosis. Small internal hemorrhoids. Otherwise normal. Colon Redundant.   COLONOSCOPY  06/11/2013   Moderate sigmoid diverticulosis. otherwise normal. Bx: Prominent benign lymphoid aggregate, otherwise normal   ORIF WRIST FRACTURE Right 11/20/2020   Procedure: OPEN REDUCTION INTERNAL FIXATION (ORIF) WRIST FRACTURE;  Surgeon: [name], MD;  Location: [facility];  Service: Orthopedics;  Laterality: Right;  with IV sedation needs 90 minutes   TUBAL LIGATION  1997   UPPER GI ENDOSCOPY  02/16/2012   Scalloping of dupdenal mucosa, suggestive of celiac disease. Small Bowel Bx: Findings consistent with Celiac Disease.    Current Medications: Current Meds  Medication Sig   apixaban (ELIQUIS) 5 MG TABS tablet Take 1 tablet (5 mg total) by mouth 2 (two) times daily.   Calcium Carb-Cholecalciferol (CALCIUM 500 + D3 PO) Take 1 tablet by mouth daily.   Coenzyme Q10-Fish Oil-Vit E (CO-Q 10 OMEGA-3 FISH OIL PO) Take 1 capsule by mouth 2 (two) times daily.   diltiazem (CARDIZEM) 30 MG tablet Take 1 tablet (30 mg total) by mouth every 6 (six) hours as needed. (Patient taking differently: Take 30  mg by mouth every 6 (six) hours as needed (Afib).)   mometasone (NASONEX) 50 MCG/ACT nasal spray Place 2 sprays into the nose every evening.   montelukast (SINGULAIR) 10 MG tablet Take 10 mg by mouth at bedtime.   nebivolol (BYSTOLIC) 5 MG tablet Take 5 mg by mouth daily.   rosuvastatin (CRESTOR) 5 MG tablet Take 5 mg by mouth every 3 (three) days.   sertraline (ZOLOFT) 25 MG tablet Take 25 mg by mouth daily.   [DISCONTINUED] metroNIDAZOLE (METROCREAM) 0.75 % cream Apply 1 application topically daily.     Allergies:   Carvedilol, Gluten meal,  Lisinopril, Losartan, and Sulfa antibiotics   Social History   Socioeconomic History   Marital status: Married    Spouse name: Not on file   Number of children: Not on file   Years of education: Not on file   Highest education level: Not on file  Occupational History   Not on file  Tobacco Use   Smoking status: Former    Types: Cigarettes    Quit date: 2000    Years since quitting: 23.6   Smokeless tobacco: Never  Vaping Use   Vaping Use: Never used  Substance and Sexual Activity   Alcohol use: Yes    Alcohol/week: 21.0 standard drinks of alcohol    Types: 21 Cans of beer per week   Drug use: Never   Sexual activity: Not on file  Other Topics Concern   Not on file  Social History Narrative   Not on file   Social Determinants of Health   Financial Resource Strain: Not on file  Food Insecurity: Not on file  Transportation Needs: Not on file  Physical Activity: Not on file  Stress: Not on file  Social Connections: Not on file     Family History: The patient's family history includes AAA (abdominal aortic aneurysm) in her father; Atrial fibrillation in her father; Dementia in her mother; Diabetes in her paternal aunt and paternal grandfather; Glaucoma in her mother; Hypercholesterolemia in her mother and sister; Hypertension in her father; Prostate cancer in her maternal grandfather and maternal uncle; Stroke in her paternal aunt. There is no history of Colon cancer. ROS:   Please see the history of present illness.    All 14 point review of systems negative except as described per history of present illness  EKGs/Labs/Other Studies Reviewed:      Recent Labs: 11/20/2020: BUN 14; Creatinine, Ser 0.60; Hemoglobin 14.3; Potassium 4.5; Sodium 135  Recent Lipid Panel No results found for: "CHOL", "TRIG", "HDL", "CHOLHDL", "VLDL", "LDLCALC", "LDLDIRECT"  Physical Exam:    VS:  BP (!) 168/90 (BP Location: Left Arm, Patient Position: Sitting)   Pulse (!) 58   Ht '5\' 6"'$   (1.676 m)   Wt 181 lb 3.2 oz (82.2 kg)   SpO2 99%   BMI 29.25 kg/m     Wt Readings from Last 3 Encounters:  08/13/21 181 lb 3.2 oz (82.2 kg)  05/21/21 180 lb 3.2 oz (81.7 kg)  03/16/21 185 lb (83.9 kg)     GEN:  Well nourished, well developed in no acute distress HEENT: Normal NECK: No JVD; No carotid bruits LYMPHATICS: No lymphadenopathy CARDIAC: RRR, no murmurs, no rubs, no gallops RESPIRATORY:  Clear to auscultation without rales, wheezing or rhonchi  ABDOMEN: Soft, non-tender, non-distended MUSCULOSKELETAL:  No edema; No deformity  SKIN: Warm and dry LOWER EXTREMITIES: no swelling NEUROLOGIC:  Alert and oriented x 3 PSYCHIATRIC:  Normal affect   ASSESSMENT:  1. Paroxysmal atrial fibrillation ([place])   2. Essential hypertension   3. Obstructive sleep apnea   4. Dyslipidemia    PLAN:    In order of problems listed above:  Paroxysmal of atrial fibrillation, chads CHA2DS2-VASc equals 3.  She is anticoagulant Eliquis which I will continue.  Luckily both atria normal in size.  I suspect the trigger for her A-fib is either sleep apnea or drinking sleep apnea will be taking care of by CPAP.  I had again long discussion with her about need to quit drinking and hopefully she will accomplish that. Essential hypertension very high today.  She said anytime she is in doctor's office is elevated she is on blood pressure monitor at home usually 130/70.  We will recheck blood pressure today before she get out of the room. Dyslipidemia I did review K PN which only her LDL of 165 HDL 66 at that time she was put on Crestor 5 mg 3 times a week.  We will recheck fasting lipid profile AST ALT   Medication Adjustments/Labs and Tests Ordered: Current medicines are reviewed at length with the patient today.  Concerns regarding medicines are outlined above.  No orders of the defined types were placed in this encounter.  Medication changes: No orders of the defined types were placed in this  encounter.   Signed, Park Liter, MD, [facility] 08/13/2021 9:20 AM    [place]

## 2021-08-13 NOTE — Patient Instructions (Signed)
Medication Instructions:  Your physician recommends that you continue on your current medications as directed. Please refer to the Current Medication list given to you today.  *If you need a refill on your cardiac medications before your next appointment, please call your pharmacy*   Lab Work: Fasting Lipid panel, AST, ALT- today If you have labs (blood work) drawn today and your tests are completely normal, you will receive your results only by: [place] (if you have MyChart) OR A paper copy in the mail If you have any lab test that is abnormal or we need to change your treatment, we will call you to review the results.   Testing/Procedures: None Ordered   Follow-Up: At [facility], you and your health needs are our priority.  As part of our continuing mission to provide you with exceptional heart care, we have created designated Provider Care Teams.  These Care Teams include your primary Cardiologist (physician) and Advanced Practice Providers (APPs -  Physician Assistants and Nurse Practitioners) who all work together to provide you with the care you need, when you need it.  We recommend signing up for the patient portal called "MyChart".  Sign up information is provided on this After Visit Summary.  MyChart is used to connect with patients for Virtual Visits (Telemedicine).  Patients are able to view lab/test results, encounter notes, upcoming appointments, etc.  Non-urgent messages can be sent to your provider as well.   To learn more about what you can do with MyChart, go to [email or web address].    Your next appointment:   6 month(s)  The format for your next appointment:   In Person  Provider:   Jenne Campus, MD    Other Instructions NA

## 2021-08-14 LAB — LIPID PANEL
Chol/HDL Ratio: 3 ratio (ref 0.0–4.4)
Cholesterol, Total: 214 mg/dL — ABNORMAL HIGH (ref 100–199)
HDL: 72 mg/dL (ref >39)
LDL Chol Calc (NIH): 120 mg/dL — ABNORMAL HIGH (ref 0–99)
Triglycerides: 127 mg/dL (ref 0–149)
VLDL Cholesterol Cal: 22 mg/dL (ref 5–40)

## 2021-08-14 LAB — ALT: ALT: 24 IU/L (ref 0–32)

## 2021-08-14 LAB — AST: AST: 23 IU/L (ref 0–40)

## 2021-08-18 ENCOUNTER — Telehealth: Payer: Self-pay | Admitting: Cardiology

## 2021-08-18 NOTE — Telephone Encounter (Signed)
Patient has decided she wants a CPAP to be ordered instead of going with the oral device. Can you assist her with this?

## 2021-08-19 ENCOUNTER — Telehealth: Payer: Self-pay

## 2021-08-19 DIAGNOSIS — E785 Hyperlipidemia, unspecified: Secondary | ICD-10-CM

## 2021-08-19 MED ORDER — ROSUVASTATIN CALCIUM 5 MG PO TABS: 5.0000 mg | ORAL_TABLET | Freq: Every day | ORAL | 2 refills | Status: DC

## 2021-08-19 NOTE — Telephone Encounter (Signed)
Message sent to Dr. Agustin Cree:   I confirmed the patient is taking 5 mg of the rosuvastatin every 3 days however, she does not want to increase the dose but rather take '5mg'$  daily and repeat labs in 6 weeks. I told her I will run this by you. I advise to go ahead and start the 5 mg daily unless you feel differently. I was confident per your protocol that advising her to start on 5 mg at lease will be appropriate to you.

## 2021-08-19 NOTE — Telephone Encounter (Signed)
-----   Message from Park Liter, MD sent at 08/18/2021 12:38 PM EDT ----- Cholesterol still elevated.  If she takes Crestor every day 5 mg we need to increase to every day for 10 mg according to chart she is taking 5 mg 3 times a week I would recommend increase to 5 mg daily done fasting lipid profile is TLT 6 weeks

## 2021-08-24 NOTE — Telephone Encounter (Signed)
I called and confirmed the patient is doing well with her 5 mg. Expressed again to be fasting for labs in 6 weeks. Patient understood.

## 2021-08-31 NOTE — Telephone Encounter (Signed)
Patient talked with her provider and has decided to get the cpap machine.  Upon patient request DME selection is [facility]. Patient understands he will be contacted by [facility] to set up his cpap. Patient understands to call if [facility] does not contact him with new setup in a timely manner. Patient understands they will be called once confirmation has been received from [facility] that they have received their new machine to schedule 10 week follow up appointment.   [facility] notified of new cpap order  Please add to airview Patient was grateful for the call and thanked me.

## 2021-09-21 DIAGNOSIS — S52501A Unspecified fracture of the lower end of right radius, initial encounter for closed fracture: Secondary | ICD-10-CM | POA: Diagnosis not present

## 2021-11-02 DIAGNOSIS — Z85828 Personal history of other malignant neoplasm of skin: Secondary | ICD-10-CM | POA: Diagnosis not present

## 2021-11-02 DIAGNOSIS — L820 Inflamed seborrheic keratosis: Secondary | ICD-10-CM | POA: Diagnosis not present

## 2021-11-26 DIAGNOSIS — Z1331 Encounter for screening for depression: Secondary | ICD-10-CM | POA: Diagnosis not present

## 2021-11-26 DIAGNOSIS — I480 Paroxysmal atrial fibrillation: Secondary | ICD-10-CM | POA: Diagnosis not present

## 2021-11-26 DIAGNOSIS — G4733 Obstructive sleep apnea (adult) (pediatric): Secondary | ICD-10-CM | POA: Diagnosis not present

## 2021-11-26 DIAGNOSIS — E785 Hyperlipidemia, unspecified: Secondary | ICD-10-CM | POA: Diagnosis not present

## 2021-11-26 DIAGNOSIS — Z0000 Encounter for general adult medical examination without abnormal findings: Secondary | ICD-10-CM | POA: Diagnosis not present

## 2021-11-26 DIAGNOSIS — D6859 Other primary thrombophilia: Secondary | ICD-10-CM | POA: Diagnosis not present

## 2021-11-26 DIAGNOSIS — K900 Celiac disease: Secondary | ICD-10-CM | POA: Diagnosis not present

## 2021-11-26 DIAGNOSIS — I700 Atherosclerosis of aorta: Secondary | ICD-10-CM | POA: Diagnosis not present

## 2021-11-26 DIAGNOSIS — I10 Essential (primary) hypertension: Secondary | ICD-10-CM | POA: Diagnosis not present

## 2021-11-26 DIAGNOSIS — Z79899 Other long term (current) drug therapy: Secondary | ICD-10-CM | POA: Diagnosis not present

## 2021-12-09 ENCOUNTER — Ambulatory Visit: Payer: Medicare PPO | Attending: Cardiology | Admitting: Cardiology

## 2021-12-09 ENCOUNTER — Ambulatory Visit: Payer: Medicare PPO | Admitting: Cardiology

## 2021-12-09 ENCOUNTER — Encounter: Payer: Self-pay | Admitting: Cardiology

## 2021-12-09 VITALS — BP 110/70 | HR 65 | Ht 66.0 in | Wt 188.6 lb

## 2021-12-09 DIAGNOSIS — G4733 Obstructive sleep apnea (adult) (pediatric): Secondary | ICD-10-CM

## 2021-12-09 DIAGNOSIS — I10 Essential (primary) hypertension: Secondary | ICD-10-CM

## 2021-12-09 NOTE — Progress Notes (Signed)
Sleep Medicine CONSULT Note    Date:  12/09/2021   ID:  Debra Mueller, DOB [birth year], MRN [id number]  PCP:  Ernestene Kiel, MD  Cardiologist: Jenne Campus, MD  Chief Complaint  Patient presents with   New Patient (Initial Visit)    Obstructive sleep apnea    History of Present Illness:  Debra Mueller is a 66 y.o. female who is being seen today for the evaluation of obstructive sleep apnea at the request of Jenne Campus, MD.  This is a 66 year old female with a history of paroxysmal atrial fibrillation, hypertension and hyperlipidemia who recently had a home sleep study ordered by Dr. Delorse Limber due to episodes of atrial fibrillation.  Home sleep study done 06/03/2021 demonstrated mild obstructive sleep apnea with an AHI of 7.2/h with no significant nocturnal hypoxemia.  She was started on auto PAP from 4 to 15 cm H2O and is now referred to establish sleep care.  She tells me that prior to there HST she tells me she had had fatigue for years.  She says that her husband said she would snore and stop breathing in her sleep.    She is doing well with her PAP device and thinks that she has gotten used to it.  She has a lot of problems falling to sleep.  She then wakes up in the middle of the night to use the bathroom and then cannot get back to sleep.  She has had issues with the head gear hurting her head.  She now has a nasal pillow mask which works better for her.  She feels the pressure is adequate.  Since going on PAP she has not really felt any more rested than prior to using CPAP.  She denies any significant mouth or nasal dryness or nasal congestion. She uses nasal saline spray nightly.  She does not think that she snores with the mask.    Past Medical History:  Diagnosis Date   Acute postoperative pain 11/18/2020   Anxiety disorder    Basal cell carcinoma    Capsulitis of metatarsophalangeal (MTP) joint of right foot 12/30/2016   Celiac disease    Chronic seasonal  allergic rhinitis 11/11/2015   Closed fracture of distal end of right radius 11/18/2020   Essential hypertension 11/11/2015   Hammer toe of second toe of right foot 12/30/2016   History of fractured kneecap    Right knee 6 weeks ago 11/19/20   Hypertension    Hypoglycemia 1994   in [place]   Hyposmolality    Mild aortic valve sclerosis    Seen on 12/2013 by Echo   RBBB (right bundle branch block)    Retinal hemorrhage     Past Surgical History:  Procedure Laterality Date   ANKLE FRACTURE SURGERY Left 2008   APPENDECTOMY  1975   CATARACT EXTRACTION Bilateral 02/2020   COLONOSCOPY  02/05/2008   Mild Sigmoid diverticulosis. Small internal hemorrhoids. Otherwise normal. Colon Redundant.   COLONOSCOPY  06/11/2013   Moderate sigmoid diverticulosis. otherwise normal. Bx: Prominent benign lymphoid aggregate, otherwise normal   ORIF WRIST FRACTURE Right 11/20/2020   Procedure: OPEN REDUCTION INTERNAL FIXATION (ORIF) WRIST FRACTURE;  Surgeon: [name], MD;  Location: [facility];  Service: Orthopedics;  Laterality: Right;  with IV sedation needs 90 minutes   TUBAL LIGATION  1997   UPPER GI ENDOSCOPY  02/16/2012   Scalloping of dupdenal mucosa, suggestive of celiac disease. Small Bowel Bx: Findings consistent with Celiac Disease.  Current Medications: Current Meds  Medication Sig   apixaban (ELIQUIS) 5 MG TABS tablet Take 1 tablet (5 mg total) by mouth 2 (two) times daily.   Calcium Carb-Cholecalciferol (CALCIUM 500 + D3 PO) Take 1 tablet by mouth daily.   Coenzyme Q10-Fish Oil-Vit E (CO-Q 10 OMEGA-3 FISH OIL PO) Take 1 capsule by mouth 2 (two) times daily.   diltiazem (CARDIZEM) 30 MG tablet Take 1 tablet (30 mg total) by mouth every 6 (six) hours as needed.   mometasone (NASONEX) 50 MCG/ACT nasal spray Place 2 sprays into the nose every evening.   montelukast (SINGULAIR) 10 MG tablet Take 10 mg by mouth at bedtime.   nebivolol (BYSTOLIC) 5 MG tablet Take 5 mg  by mouth daily.   rosuvastatin (CRESTOR) 5 MG tablet Take 1 tablet (5 mg total) by mouth daily.   sertraline (ZOLOFT) 25 MG tablet Take 25 mg by mouth daily.    Allergies:   Carvedilol, Gluten meal, Lisinopril, Losartan, and Sulfa antibiotics   Social History   Socioeconomic History   Marital status: Married    Spouse name: Not on file   Number of children: Not on file   Years of education: Not on file   Highest education level: Not on file  Occupational History   Not on file  Tobacco Use   Smoking status: Former    Types: Cigarettes    Quit date: 2000    Years since quitting: 23.9   Smokeless tobacco: Never  Vaping Use   Vaping Use: Never used  Substance and Sexual Activity   Alcohol use: Yes    Alcohol/week: 21.0 standard drinks of alcohol    Types: 21 Cans of beer per week   Drug use: Never   Sexual activity: Not on file  Other Topics Concern   Not on file  Social History Narrative   Not on file   Social Determinants of Health   Financial Resource Strain: Not on file  Food Insecurity: Not on file  Transportation Needs: Not on file  Physical Activity: Not on file  Stress: Not on file  Social Connections: Not on file     Family History:  The patient's family history includes AAA (abdominal aortic aneurysm) in her father; Atrial fibrillation in her father; Dementia in her mother; Diabetes in her paternal aunt and paternal grandfather; Glaucoma in her mother; Hypercholesterolemia in her mother and sister; Hypertension in her father; Prostate cancer in her maternal grandfather and maternal uncle; Stroke in her paternal aunt.   ROS:   Please see the history of present illness.    ROS All other systems reviewed and are negative.      No data to display             PHYSICAL EXAM:   VS:  BP 110/70   Pulse 65   Ht '5\' 6"'$  (1.676 m)   Wt 188 lb 9.6 oz (85.5 kg)   SpO2 98%   BMI 30.44 kg/m    GEN: Well nourished, well developed, in no acute distress  HEENT:  normal  Neck: no JVD, carotid bruits, or masses Cardiac: RRR; no murmurs, rubs, or gallops,no edema.  Intact distal pulses bilaterally.  Respiratory:  clear to auscultation bilaterally, normal work of breathing GI: soft, nontender, nondistended, + BS MS: no deformity or atrophy  Skin: warm and dry, no rash Neuro:  Alert and Oriented x 3, Strength and sensation are intact Psych: euthymic mood, full affect  Wt Readings from Last 3  Encounters:  12/09/21 188 lb 9.6 oz (85.5 kg)  08/13/21 181 lb 3.2 oz (82.2 kg)  05/21/21 180 lb 3.2 oz (81.7 kg)      Studies/Labs Reviewed:   Home sleep study and PAP compliance download  Recent Labs: 08/13/2021: ALT 24    Additional studies/ records that were reviewed today include:  Office visit notes from Dr. Agustin Cree    ASSESSMENT:    1. Obstructive sleep apnea   2. Essential hypertension      PLAN:  In order of problems listed above:  OSA - The patient is tolerating PAP therapy well without any problems. The PAP download performed by his DME was personally reviewed and interpreted by me today and showed an AHI of 3.7 /hr on auto CPAP from 4-15 cm H2O with 97% compliance in using more than 4 hours nightly.  The patient has been using and benefiting from PAP use and will continue to benefit from therapy.   2.  Hypertension -BP controlled on exam today -Continue prescription drug management with Bystolic 5 mg daily with as needed refills  Time Spent: 20 minutes total time of encounter, including 15 minutes spent in face-to-face patient care on the date of this encounter. This time includes coordination of care and counseling regarding above mentioned problem list. Remainder of non-face-to-face time involved reviewing chart documents/testing relevant to the patient encounter and documentation in the medical record. I have independently reviewed documentation from referring provider  Medication Adjustments/Labs and Tests Ordered: Current  medicines are reviewed at length with the patient today.  Concerns regarding medicines are outlined above.  Medication changes, Labs and Tests ordered today are listed in the Patient Instructions below.  There are no Patient Instructions on file for this visit.   Signed, Fransico Him, MD  12/09/2021 2:41 PM    [address] [place], [place], [place]  [address] Phone: [phone]; Fax: [phone]

## 2021-12-09 NOTE — Patient Instructions (Signed)
Medication Instructions:  Your physician recommends that you continue on your current medications as directed. Please refer to the Current Medication list given to you today.  *If you need a refill on your cardiac medications before your next appointment, please call your pharmacy*   Lab Work: None. If you have labs (blood work) drawn today and your tests are completely normal, you will receive your results only by: [place] (if you have MyChart) OR A paper copy in the mail If you have any lab test that is abnormal or we need to change your treatment, we will call you to review the results.   Testing/Procedures: None.   Follow-Up: At [facility], you and your health needs are our priority.  As part of our continuing mission to provide you with exceptional heart care, we have created designated Provider Care Teams.  These Care Teams include your primary Cardiologist (physician) and Advanced Practice Providers (APPs -  Physician Assistants and Nurse Practitioners) who all work together to provide you with the care you need, when you need it.  We recommend signing up for the patient portal called "MyChart".  Sign up information is provided on this After Visit Summary.  MyChart is used to connect with patients for Virtual Visits (Telemedicine).  Patients are able to view lab/test results, encounter notes, upcoming appointments, etc.  Non-urgent messages can be sent to your provider as well.   To learn more about what you can do with MyChart, go to [email or web address].    Your next appointment:   12 month(s)  The format for your next appointment:   In Person  Provider:   Dr. Fransico Him, MD     Important Information About Sugar

## 2022-01-22 DIAGNOSIS — G4733 Obstructive sleep apnea (adult) (pediatric): Secondary | ICD-10-CM | POA: Diagnosis not present

## 2022-01-28 ENCOUNTER — Other Ambulatory Visit: Payer: Self-pay

## 2022-01-28 DIAGNOSIS — I480 Paroxysmal atrial fibrillation: Secondary | ICD-10-CM

## 2022-01-28 MED ORDER — APIXABAN 5 MG PO TABS: 5.0000 mg | ORAL_TABLET | Freq: Two times a day (BID) | ORAL | 3 refills | Status: DC

## 2022-01-28 NOTE — Telephone Encounter (Signed)
Prescription refill request for Eliquis received. Indication: Afib  Last office visit: 12/09/21 [facility])  Scr: 0.72 (11/26/21 via [place])  Age: 67 Weight: 85.5kg  Appropriate dose and refill sent to requested pharmacy.

## 2022-02-02 DIAGNOSIS — K130 Diseases of lips: Secondary | ICD-10-CM | POA: Diagnosis not present

## 2022-02-02 DIAGNOSIS — Z85828 Personal history of other malignant neoplasm of skin: Secondary | ICD-10-CM | POA: Diagnosis not present

## 2022-02-02 DIAGNOSIS — L821 Other seborrheic keratosis: Secondary | ICD-10-CM | POA: Diagnosis not present

## 2022-02-09 DIAGNOSIS — H43813 Vitreous degeneration, bilateral: Secondary | ICD-10-CM | POA: Diagnosis not present

## 2022-02-09 DIAGNOSIS — H26493 Other secondary cataract, bilateral: Secondary | ICD-10-CM | POA: Diagnosis not present

## 2022-02-09 DIAGNOSIS — H04123 Dry eye syndrome of bilateral lacrimal glands: Secondary | ICD-10-CM | POA: Diagnosis not present

## 2022-02-09 DIAGNOSIS — H40013 Open angle with borderline findings, low risk, bilateral: Secondary | ICD-10-CM | POA: Diagnosis not present

## 2022-02-22 DIAGNOSIS — G4733 Obstructive sleep apnea (adult) (pediatric): Secondary | ICD-10-CM | POA: Diagnosis not present

## 2022-03-17 DIAGNOSIS — G4733 Obstructive sleep apnea (adult) (pediatric): Secondary | ICD-10-CM | POA: Diagnosis not present

## 2022-03-23 DIAGNOSIS — G4733 Obstructive sleep apnea (adult) (pediatric): Secondary | ICD-10-CM | POA: Diagnosis not present

## 2022-04-23 DIAGNOSIS — G4733 Obstructive sleep apnea (adult) (pediatric): Secondary | ICD-10-CM | POA: Diagnosis not present

## 2022-04-29 ENCOUNTER — Other Ambulatory Visit: Payer: Self-pay

## 2022-04-29 DIAGNOSIS — F419 Anxiety disorder, unspecified: Secondary | ICD-10-CM | POA: Insufficient documentation

## 2022-04-29 DIAGNOSIS — E871 Hypo-osmolality and hyponatremia: Secondary | ICD-10-CM | POA: Insufficient documentation

## 2022-04-29 DIAGNOSIS — I4510 Unspecified right bundle-branch block: Secondary | ICD-10-CM | POA: Insufficient documentation

## 2022-04-29 DIAGNOSIS — C4491 Basal cell carcinoma of skin, unspecified: Secondary | ICD-10-CM | POA: Insufficient documentation

## 2022-04-29 DIAGNOSIS — I358 Other nonrheumatic aortic valve disorders: Secondary | ICD-10-CM | POA: Insufficient documentation

## 2022-04-29 DIAGNOSIS — Z8781 Personal history of (healed) traumatic fracture: Secondary | ICD-10-CM | POA: Insufficient documentation

## 2022-04-29 DIAGNOSIS — H3560 Retinal hemorrhage, unspecified eye: Secondary | ICD-10-CM | POA: Insufficient documentation

## 2022-05-06 NOTE — Progress Notes (Deleted)
Cardiology Office Note:    Date:  05/06/2022   ID:  Debra Mueller, DOB 07/09/1955, MRN [id number]  PCP:  Philemon Kingdom, MD   [facility] Providers Cardiologist:  None { Click to update primary MD,subspecialty MD or APP then REFRESH:1}    Referring MD: Philemon Kingdom, MD   No chief complaint on file. ***  History of Present Illness:    Debra Mueller is a 67 y.o. female with a hx of hypertension, paroxysmal atrial fibrillation, mild aortic valve sclerosis, right BBB, OSA, hyperlipidemia, EtOH abuse.  Establish care for management of atrial fibrillation and hypertension with Dr. Bing Matter in 2023.  3 weeks prior to this visit she woke up in the morning not feeling well, exhausted, checked her blood pressure and noticed that her heart rate was elevated.  She used her husband's Apple Watch and it advised her she was in atrial fibrillation.  She was evaluated by her PCP the following day confirmed to be in atrial fibrillation, was started on Eliquis however she never took it, as apparently she felt better the next day.  Her CHA2DS2-VASc was 3 and again anticoagulation with Eliquis was recommended.  Lab work was overall unrevealing.  It was felt that possibly OSA or EtOH use was contributing to her atrial fibrillation.  She was started on Cardizem 30 mg as needed for palpitations. Home sleep study was ordered, demonstrating mild OSA.  She was referred to Dr. Mayford Knife for follow-up, was started on PAP.  Echo on 03/27/2021 revealed An EF of 60 to 65%, mild LVH, grade 2 DD, mild MR.   Most recently she was evaluated by Dr. Bing Matter on 08/13/2021 at that time her blood pressure was elevated at 168/90, however she reported typically well-controlled at home.    Past Medical History:  Diagnosis Date   Acute postoperative pain 11/18/2020   Anxiety disorder    Basal cell carcinoma    Capsulitis of metatarsophalangeal (MTP) joint of right foot 12/30/2016   Celiac disease    Chronic  seasonal allergic rhinitis 11/11/2015   Closed fracture of distal end of right radius 11/18/2020   Dyslipidemia 03/16/2021   Essential hypertension 11/11/2015   ETOH abuse 03/16/2021   Hammer toe of second toe of right foot 12/30/2016   History of fractured kneecap    Right knee 6 weeks ago 11/19/20   Hypoglycemia 1994   in [place]   Hyposmolality    Mild aortic valve sclerosis    Seen on 12/2013 by Echo   Obstructive sleep apnea 05/21/2021   Paroxysmal atrial fibrillation (HCC) 03/16/2021   RBBB (right bundle branch block)    Retinal hemorrhage     Past Surgical History:  Procedure Laterality Date   ANKLE FRACTURE SURGERY Left 2008   APPENDECTOMY  1975   CATARACT EXTRACTION Bilateral 02/2020   COLONOSCOPY  02/05/2008   Mild Sigmoid diverticulosis. Small internal hemorrhoids. Otherwise normal. Colon Redundant.   COLONOSCOPY  06/11/2013   Moderate sigmoid diverticulosis. otherwise normal. Bx: Prominent benign lymphoid aggregate, otherwise normal   ORIF WRIST FRACTURE Right 11/20/2020   Procedure: OPEN REDUCTION INTERNAL FIXATION (ORIF) WRIST FRACTURE;  Surgeon: Gomez Cleverly, MD;  Location: [facility];  Service: Orthopedics;  Laterality: Right;  with IV sedation needs 90 minutes   TUBAL LIGATION  1997   UPPER GI ENDOSCOPY  02/16/2012   Scalloping of dupdenal mucosa, suggestive of celiac disease. Small Bowel Bx: Findings consistent with Celiac Disease.    Current Medications: No outpatient medications have been  marked as taking for the 05/07/22 encounter (Appointment) with [name], NP.     Allergies:   Carvedilol, Gluten meal, Lisinopril, Losartan, and Sulfa antibiotics   Social History   Socioeconomic History   Marital status: Married    Spouse name: Not on file   Number of children: Not on file   Years of education: Not on file   Highest education level: Not on file  Occupational History   Not on file  Tobacco Use   Smoking status: Former     Types: Cigarettes    Quit date: 2000    Years since quitting: 24.3   Smokeless tobacco: Never  Vaping Use   Vaping Use: Never used  Substance and Sexual Activity   Alcohol use: Yes    Alcohol/week: 21.0 standard drinks of alcohol    Types: 21 Cans of beer per week   Drug use: Never   Sexual activity: Not on file  Other Topics Concern   Not on file  Social History Narrative   Not on file   Social Determinants of Health   Financial Resource Strain: Not on file  Food Insecurity: Not on file  Transportation Needs: Not on file  Physical Activity: Not on file  Stress: Not on file  Social Connections: Not on file     Family History: The patient's ***family history includes AAA (abdominal aortic aneurysm) in her father; Atrial fibrillation in her father; Dementia in her mother; Diabetes in her paternal aunt and paternal grandfather; Glaucoma in her mother; Hypercholesterolemia in her mother and sister; Hypertension in her father; Prostate cancer in her maternal grandfather and maternal uncle; Stroke in her paternal aunt. There is no history of Colon cancer.  ROS:   Please see the history of present illness.    *** All other systems reviewed and are negative.  EKGs/Labs/Other Studies Reviewed:    The following studies were reviewed today: ***  EKG:  EKG is *** ordered today.  The ekg ordered today demonstrates ***  Recent Labs: 08/13/2021: ALT 24  Recent Lipid Panel    Component Value Date/Time   CHOL 214 (H) 08/13/2021 0933   TRIG 127 08/13/2021 0933   HDL 72 08/13/2021 0933   CHOLHDL 3.0 08/13/2021 0933   LDLCALC 120 (H) 08/13/2021 0933     Risk Assessment/Calculations:   {Does this patient have ATRIAL FIBRILLATION?:[phone]}  No BP recorded.  {Refresh Note OR Click here to enter BP  :1}***    STOP-Bang Score:  4  { Consider Dx Sleep Disordered Breathing or Sleep Apnea  ICD G47.33          :1}     Physical Exam:    VS:  There were no vitals taken for this  visit.    Wt Readings from Last 3 Encounters:  12/09/21 188 lb 9.6 oz (85.5 kg)  08/13/21 181 lb 3.2 oz (82.2 kg)  05/21/21 180 lb 3.2 oz (81.7 kg)     GEN: *** Well nourished, well developed in no acute distress HEENT: Normal NECK: No JVD; No carotid bruits LYMPHATICS: No lymphadenopathy CARDIAC: ***RRR, no murmurs, rubs, gallops RESPIRATORY:  Clear to auscultation without rales, wheezing or rhonchi  ABDOMEN: Soft, non-tender, non-distended MUSCULOSKELETAL:  No edema; No deformity  SKIN: Warm and dry NEUROLOGIC:  Alert and oriented x 3 PSYCHIATRIC:  Normal affect   ASSESSMENT:    1. Paroxysmal atrial fibrillation (HCC)   2. Obstructive sleep apnea   3. ETOH abuse   4. Dyslipidemia  5. Essential hypertension    PLAN:    In order of problems listed above:  ***      {Are you ordering a CV Procedure (e.g. stress test, cath, DCCV, TEE, etc)?   Press F2        :[id number]}    Medication Adjustments/Labs and Tests Ordered: Current medicines are reviewed at length with the patient today.  Concerns regarding medicines are outlined above.  No orders of the defined types were placed in this encounter.  No orders of the defined types were placed in this encounter.   There are no Patient Instructions on file for this visit.   Signed, [name], NP  05/06/2022 7:58 PM    [facility]

## 2022-05-07 ENCOUNTER — Ambulatory Visit: Payer: Medicare PPO | Admitting: Cardiology

## 2022-05-07 DIAGNOSIS — E785 Hyperlipidemia, unspecified: Secondary | ICD-10-CM

## 2022-05-07 DIAGNOSIS — I480 Paroxysmal atrial fibrillation: Secondary | ICD-10-CM

## 2022-05-07 DIAGNOSIS — F1010 Alcohol abuse, uncomplicated: Secondary | ICD-10-CM

## 2022-05-07 DIAGNOSIS — G4733 Obstructive sleep apnea (adult) (pediatric): Secondary | ICD-10-CM

## 2022-05-07 DIAGNOSIS — I10 Essential (primary) hypertension: Secondary | ICD-10-CM

## 2022-05-23 DIAGNOSIS — G4733 Obstructive sleep apnea (adult) (pediatric): Secondary | ICD-10-CM | POA: Diagnosis not present

## 2022-05-27 DIAGNOSIS — M19041 Primary osteoarthritis, right hand: Secondary | ICD-10-CM | POA: Diagnosis not present

## 2022-05-27 DIAGNOSIS — I480 Paroxysmal atrial fibrillation: Secondary | ICD-10-CM | POA: Diagnosis not present

## 2022-05-27 DIAGNOSIS — Z6831 Body mass index (BMI) 31.0-31.9, adult: Secondary | ICD-10-CM | POA: Diagnosis not present

## 2022-05-27 DIAGNOSIS — I10 Essential (primary) hypertension: Secondary | ICD-10-CM | POA: Diagnosis not present

## 2022-05-27 DIAGNOSIS — D6859 Other primary thrombophilia: Secondary | ICD-10-CM | POA: Diagnosis not present

## 2022-05-27 DIAGNOSIS — E785 Hyperlipidemia, unspecified: Secondary | ICD-10-CM | POA: Diagnosis not present

## 2022-05-27 DIAGNOSIS — I700 Atherosclerosis of aorta: Secondary | ICD-10-CM | POA: Diagnosis not present

## 2022-05-27 DIAGNOSIS — Z79899 Other long term (current) drug therapy: Secondary | ICD-10-CM | POA: Diagnosis not present

## 2022-05-30 ENCOUNTER — Encounter: Payer: Self-pay | Admitting: Cardiology

## 2022-05-30 NOTE — Progress Notes (Deleted)
Cardiology Office Note:    Date:  05/30/2022   ID:  Debra Mueller, DOB Jan 06, 1956, MRN [id number]  PCP:  Philemon Kingdom, MD   [facility] Providers Cardiologist:  None { Click to update primary MD,subspecialty MD or APP then REFRESH:1}    Referring MD: Philemon Kingdom, MD   No chief complaint on file. ***  History of Present Illness:    Debra Mueller is a 67 y.o. female with a hx of aortic atherosclerosis noted on CT abdomen in 2023, paroxysmal atrial fibrillation, OSA on PAP managed by Dr. Mayford Knife, mild AS, hypertension, dyslipidemia.  She establish care with Dr. Bing Matter in March 2023 at the behest of her PCP for PAF.  CHA2DS2-VASc score of 3, her Eliquis was continued.  She was evaluated for OSA, and eventually was started on PAP.  Echocardiogram in March 2023 revealed an EF of 60 to 65%, mild LVH, grade 2 DD, mild MR.  LDL in August 2023 was 120  Needs CMET, CBC  Past Medical History:  Diagnosis Date   Acute postoperative pain 11/18/2020   Anxiety disorder    Basal cell carcinoma    Capsulitis of metatarsophalangeal (MTP) joint of right foot 12/30/2016   Celiac disease    Chronic seasonal allergic rhinitis 11/11/2015   Closed fracture of distal end of right radius 11/18/2020   Dyslipidemia 03/16/2021   Essential hypertension 11/11/2015   ETOH abuse 03/16/2021   Hammer toe of second toe of right foot 12/30/2016   History of fractured kneecap    Right knee 6 weeks ago 11/19/20   Hypoglycemia 1994   in [place]   Hyposmolality    Mild aortic valve sclerosis    Seen on 12/2013 by Echo   Obstructive sleep apnea 05/21/2021   Paroxysmal atrial fibrillation (HCC) 03/16/2021   RBBB (right bundle branch block)    Retinal hemorrhage     Past Surgical History:  Procedure Laterality Date   ANKLE FRACTURE SURGERY Left 2008   APPENDECTOMY  1975   CATARACT EXTRACTION Bilateral 02/2020   COLONOSCOPY  02/05/2008   Mild Sigmoid diverticulosis. Small  internal hemorrhoids. Otherwise normal. Colon Redundant.   COLONOSCOPY  06/11/2013   Moderate sigmoid diverticulosis. otherwise normal. Bx: Prominent benign lymphoid aggregate, otherwise normal   ORIF WRIST FRACTURE Right 11/20/2020   Procedure: OPEN REDUCTION INTERNAL FIXATION (ORIF) WRIST FRACTURE;  Surgeon: Gomez Cleverly, MD;  Location: [facility];  Service: Orthopedics;  Laterality: Right;  with IV sedation needs 90 minutes   TUBAL LIGATION  1997   UPPER GI ENDOSCOPY  02/16/2012   Scalloping of dupdenal mucosa, suggestive of celiac disease. Small Bowel Bx: Findings consistent with Celiac Disease.    Current Medications: No outpatient medications have been marked as taking for the 05/31/22 encounter (Appointment) with [name], NP.     Allergies:   Carvedilol, Gluten meal, Lisinopril, Losartan, and Sulfa antibiotics   Social History   Socioeconomic History   Marital status: Married    Spouse name: Not on file   Number of children: Not on file   Years of education: Not on file   Highest education level: Not on file  Occupational History   Not on file  Tobacco Use   Smoking status: Former    Types: Cigarettes    Quit date: 2000    Years since quitting: 24.3   Smokeless tobacco: Never  Vaping Use   Vaping Use: Never used  Substance and Sexual Activity   Alcohol use: Yes  Alcohol/week: 21.0 standard drinks of alcohol    Types: 21 Cans of beer per week   Drug use: Never   Sexual activity: Not on file  Other Topics Concern   Not on file  Social History Narrative   Not on file   Social Determinants of Health   Financial Resource Strain: Not on file  Food Insecurity: Not on file  Transportation Needs: Not on file  Physical Activity: Not on file  Stress: Not on file  Social Connections: Not on file     Family History: The patient's ***family history includes AAA (abdominal aortic aneurysm) in her father; Atrial fibrillation in her father;  Dementia in her mother; Diabetes in her paternal aunt and paternal grandfather; Glaucoma in her mother; Hypercholesterolemia in her mother and sister; Hypertension in her father; Prostate cancer in her maternal grandfather and maternal uncle; Stroke in her paternal aunt. There is no history of Colon cancer.  ROS:   Please see the history of present illness.    *** All other systems reviewed and are negative.  EKGs/Labs/Other Studies Reviewed:    The following studies were reviewed today: ***  EKG:  EKG is *** ordered today.  The ekg ordered today demonstrates ***  Recent Labs: 08/13/2021: ALT 24  Recent Lipid Panel    Component Value Date/Time   CHOL 214 (H) 08/13/2021 0933   TRIG 127 08/13/2021 0933   HDL 72 08/13/2021 0933   CHOLHDL 3.0 08/13/2021 0933   LDLCALC 120 (H) 08/13/2021 0933     Risk Assessment/Calculations:   {Does this patient have ATRIAL FIBRILLATION?:[phone]}  No BP recorded.  {Refresh Note OR Click here to enter BP  :1}***    STOP-Bang Score:  4  { Consider Dx Sleep Disordered Breathing or Sleep Apnea  ICD G47.33          :1}     Physical Exam:    VS:  There were no vitals taken for this visit.    Wt Readings from Last 3 Encounters:  12/09/21 188 lb 9.6 oz (85.5 kg)  08/13/21 181 lb 3.2 oz (82.2 kg)  05/21/21 180 lb 3.2 oz (81.7 kg)     GEN: *** Well nourished, well developed in no acute distress HEENT: Normal NECK: No JVD; No carotid bruits LYMPHATICS: No lymphadenopathy CARDIAC: ***RRR, no murmurs, rubs, gallops RESPIRATORY:  Clear to auscultation without rales, wheezing or rhonchi  ABDOMEN: Soft, non-tender, non-distended MUSCULOSKELETAL:  No edema; No deformity  SKIN: Warm and dry NEUROLOGIC:  Alert and oriented x 3 PSYCHIATRIC:  Normal affect   ASSESSMENT:    No diagnosis found. PLAN:    In order of problems listed above:  ***      {Are you ordering a CV Procedure (e.g. stress test, cath, DCCV, TEE, etc)?   Press F2         :[id number]}    Medication Adjustments/Labs and Tests Ordered: Current medicines are reviewed at length with the patient today.  Concerns regarding medicines are outlined above.  No orders of the defined types were placed in this encounter.  No orders of the defined types were placed in this encounter.   There are no Patient Instructions on file for this visit.   Signed, [name], NP  05/30/2022 3:56 PM    [facility]

## 2022-05-31 ENCOUNTER — Ambulatory Visit: Payer: Medicare PPO | Admitting: Cardiology

## 2022-05-31 DIAGNOSIS — D6859 Other primary thrombophilia: Secondary | ICD-10-CM

## 2022-05-31 DIAGNOSIS — I10 Essential (primary) hypertension: Secondary | ICD-10-CM

## 2022-05-31 DIAGNOSIS — G4733 Obstructive sleep apnea (adult) (pediatric): Secondary | ICD-10-CM

## 2022-05-31 DIAGNOSIS — E785 Hyperlipidemia, unspecified: Secondary | ICD-10-CM

## 2022-05-31 DIAGNOSIS — I480 Paroxysmal atrial fibrillation: Secondary | ICD-10-CM

## 2022-05-31 DIAGNOSIS — I700 Atherosclerosis of aorta: Secondary | ICD-10-CM

## 2022-05-31 NOTE — Progress Notes (Deleted)
Cardiology Office Note:    Date:  05/31/2022   ID:  Debra Mueller, DOB [birth year], MRN [id number]  PCP:  Philemon Kingdom, MD   [facility] Providers Cardiologist:  None { Click to update primary MD,subspecialty MD or APP then REFRESH:1}    Referring MD: Philemon Kingdom, MD   No chief complaint on file. ***  History of Present Illness:    Debra Mueller is a 67 y.o. female with a hx of aortic atherosclerosis noted on CT abdomen in 2023, paroxysmal atrial fibrillation, OSA on PAP managed by Dr. Mayford Knife, mild AS, hypertension, dyslipidemia.  She establish care with Dr. Bing Matter in March 2023 at the behest of her PCP for PAF.  CHA2DS2-VASc score of 3, her Eliquis was continued.  She was evaluated for OSA, and eventually was started on PAP.  Echocardiogram in March 2023 revealed an EF of 60 to 65%, mild LVH, grade 2 DD, mild MR.  LDL in August 2023 was 120  Needs CMET, CBC  Past Medical History:  Diagnosis Date   Acute postoperative pain 11/18/2020   Anxiety disorder    Aortic atherosclerosis (HCC) 2023   Noted on CT abdomen   Basal cell carcinoma    Capsulitis of metatarsophalangeal (MTP) joint of right foot 12/30/2016   Celiac disease    Chronic seasonal allergic rhinitis 11/11/2015   Closed fracture of distal end of right radius 11/18/2020   Dyslipidemia 03/16/2021   Essential hypertension 11/11/2015   ETOH abuse 03/16/2021   Hammer toe of second toe of right foot 12/30/2016   History of fractured kneecap    Right knee 6 weeks ago 11/19/20   Hypoglycemia 1994   in [place]   Hyposmolality    Mild aortic valve sclerosis    Seen on 12/2013 by Echo   Obstructive sleep apnea 05/21/2021   Paroxysmal atrial fibrillation (HCC) 03/16/2021   RBBB (right bundle branch block)    Retinal hemorrhage     Past Surgical History:  Procedure Laterality Date   ANKLE FRACTURE SURGERY Left 2008   APPENDECTOMY  1975   CATARACT EXTRACTION Bilateral 02/2020    COLONOSCOPY  02/05/2008   Mild Sigmoid diverticulosis. Small internal hemorrhoids. Otherwise normal. Colon Redundant.   COLONOSCOPY  06/11/2013   Moderate sigmoid diverticulosis. otherwise normal. Bx: Prominent benign lymphoid aggregate, otherwise normal   ORIF WRIST FRACTURE Right 11/20/2020   Procedure: OPEN REDUCTION INTERNAL FIXATION (ORIF) WRIST FRACTURE;  Surgeon: Gomez Cleverly, MD;  Location: [facility] ;  Service: Orthopedics;  Laterality: Right;  with IV sedation needs 90 minutes   TUBAL LIGATION  1997   UPPER GI ENDOSCOPY  02/16/2012   Scalloping of dupdenal mucosa, suggestive of celiac disease. Small Bowel Bx: Findings consistent with Celiac Disease.    Current Medications: No outpatient medications have been marked as taking for the 06/01/22 encounter (Appointment) with Debra Dibble, NP.     Allergies:   Carvedilol, Gluten meal, Lisinopril, Losartan, and Sulfa antibiotics   Social History   Socioeconomic History   Marital status: Married    Spouse name: Not on file   Number of children: Not on file   Years of education: Not on file   Highest education level: Not on file  Occupational History   Not on file  Tobacco Use   Smoking status: Former    Types: Cigarettes    Quit date: 2000    Years since quitting: 24.4   Smokeless tobacco: Never  Vaping Use   Vaping Use: Never  used  Substance and Sexual Activity   Alcohol use: Yes    Alcohol/week: 21.0 standard drinks of alcohol    Types: 21 Cans of beer per week   Drug use: Never   Sexual activity: Not on file  Other Topics Concern   Not on file  Social History Narrative   Not on file   Social Determinants of Health   Financial Resource Strain: Not on file  Food Insecurity: Not on file  Transportation Needs: Not on file  Physical Activity: Not on file  Stress: Not on file  Social Connections: Not on file     Family History: The patient's ***family history includes AAA (abdominal aortic  aneurysm) in her father; Atrial fibrillation in her father; Dementia in her mother; Diabetes in her paternal aunt and paternal grandfather; Glaucoma in her mother; Hypercholesterolemia in her mother and sister; Hypertension in her father; Prostate cancer in her maternal grandfather and maternal uncle; Stroke in her paternal aunt. There is no history of Colon cancer.  ROS:   Please see the history of present illness.    *** All other systems reviewed and are negative.  EKGs/Labs/Other Studies Reviewed:    The following studies were reviewed today: ***  EKG:  EKG is *** ordered today.  The ekg ordered today demonstrates ***  Recent Labs: 08/13/2021: ALT 24  Recent Lipid Panel    Component Value Date/Time   CHOL 214 (H) 08/13/2021 0933   TRIG 127 08/13/2021 0933   HDL 72 08/13/2021 0933   CHOLHDL 3.0 08/13/2021 0933   LDLCALC 120 (H) 08/13/2021 0933     Risk Assessment/Calculations:   {Does this patient have ATRIAL FIBRILLATION?:[phone]}  No BP recorded.  {Refresh Note OR Click here to enter BP  :1}***    STOP-Bang Score:  4  { Consider Dx Sleep Disordered Breathing or Sleep Apnea  ICD G47.33          :1}     Physical Exam:    VS:  There were no vitals taken for this visit.    Wt Readings from Last 3 Encounters:  12/09/21 188 lb 9.6 oz (85.5 kg)  08/13/21 181 lb 3.2 oz (82.2 kg)  05/21/21 180 lb 3.2 oz (81.7 kg)     GEN: *** Well nourished, well developed in no acute distress HEENT: Normal NECK: No JVD; No carotid bruits LYMPHATICS: No lymphadenopathy CARDIAC: ***RRR, no murmurs, rubs, gallops RESPIRATORY:  Clear to auscultation without rales, wheezing or rhonchi  ABDOMEN: Soft, non-tender, non-distended MUSCULOSKELETAL:  No edema; No deformity  SKIN: Warm and dry NEUROLOGIC:  Alert and oriented x 3 PSYCHIATRIC:  Normal affect   ASSESSMENT:    No diagnosis found. PLAN:    In order of problems listed above:  ***      {Are you ordering a CV Procedure  (e.g. stress test, cath, DCCV, TEE, etc)?   Press F2        :[id number]}    Medication Adjustments/Labs and Tests Ordered: Current medicines are reviewed at length with the patient today.  Concerns regarding medicines are outlined above.  No orders of the defined types were placed in this encounter.  No orders of the defined types were placed in this encounter.   There are no Patient Instructions on file for this visit.   Signed, Debra Dibble, NP  05/31/2022 12:32 PM    [facility]

## 2022-06-01 ENCOUNTER — Ambulatory Visit: Payer: Medicare PPO | Admitting: Cardiology

## 2022-06-09 DIAGNOSIS — G4733 Obstructive sleep apnea (adult) (pediatric): Secondary | ICD-10-CM | POA: Diagnosis not present

## 2022-06-13 NOTE — Progress Notes (Unsigned)
Cardiology Office Note:    Date:  06/13/2022   ID:  Debra Mueller, DOB 08-10-1955, MRN [id number]  PCP:  Philemon Kingdom, MD   [facility] Providers Cardiologist:  None { Click to update primary MD,subspecialty MD or APP then REFRESH:1}    Referring MD: Philemon Kingdom, MD   No chief complaint on file. ***  History of Present Illness:    Debra Mueller is a 67 y.o. female with a hx of aortic atherosclerosis noted on CT abdomen in 2023, paroxysmal atrial fibrillation, OSA on PAP managed by Dr. Mayford Knife, mild AS, hypertension, dyslipidemia.  She establish care with Dr. Bing Matter in March 2023 at the behest of her PCP for PAF.  CHA2DS2-VASc score of 3, her Eliquis was continued.  She was evaluated for OSA, and eventually was started on PAP.  Echocardiogram in March 2023 revealed an EF of 60 to 65%, mild LVH, grade 2 DD, mild MR.  LDL in August 2023 was 120  Needs CMET, CBC  Past Medical History:  Diagnosis Date   Acute postoperative pain 11/18/2020   Anxiety disorder    Aortic atherosclerosis (HCC) 2023   Noted on CT abdomen   Basal cell carcinoma    Capsulitis of metatarsophalangeal (MTP) joint of right foot 12/30/2016   Celiac disease    Chronic seasonal allergic rhinitis 11/11/2015   Closed fracture of distal end of right radius 11/18/2020   Dyslipidemia 03/16/2021   Essential hypertension 11/11/2015   ETOH abuse 03/16/2021   Hammer toe of second toe of right foot 12/30/2016   History of fractured kneecap    Right knee 6 weeks ago 11/19/20   Hypoglycemia 1994   in [place]   Hyposmolality    Mild aortic valve sclerosis    Seen on 12/2013 by Echo   Obstructive sleep apnea 05/21/2021   Paroxysmal atrial fibrillation (HCC) 03/16/2021   RBBB (right bundle branch block)    Retinal hemorrhage     Past Surgical History:  Procedure Laterality Date   ANKLE FRACTURE SURGERY Left 2008   APPENDECTOMY  1975   CATARACT EXTRACTION Bilateral 02/2020    COLONOSCOPY  02/05/2008   Mild Sigmoid diverticulosis. Small internal hemorrhoids. Otherwise normal. Colon Redundant.   COLONOSCOPY  06/11/2013   Moderate sigmoid diverticulosis. otherwise normal. Bx: Prominent benign lymphoid aggregate, otherwise normal   ORIF WRIST FRACTURE Right 11/20/2020   Procedure: OPEN REDUCTION INTERNAL FIXATION (ORIF) WRIST FRACTURE;  Surgeon: Gomez Cleverly, MD;  Location: [facility];  Service: Orthopedics;  Laterality: Right;  with IV sedation needs 90 minutes   TUBAL LIGATION  1997   UPPER GI ENDOSCOPY  02/16/2012   Scalloping of dupdenal mucosa, suggestive of celiac disease. Small Bowel Bx: Findings consistent with Celiac Disease.    Current Medications: No outpatient medications have been marked as taking for the 06/15/22 encounter (Appointment) with [name], NP.     Allergies:   Carvedilol, Gluten meal, Lisinopril, Losartan, and Sulfa antibiotics   Social History   Socioeconomic History   Marital status: Married    Spouse name: Not on file   Number of children: Not on file   Years of education: Not on file   Highest education level: Not on file  Occupational History   Not on file  Tobacco Use   Smoking status: Former    Types: Cigarettes    Quit date: 2000    Years since quitting: 24.4   Smokeless tobacco: Never  Vaping Use   Vaping Use: Never  used  Substance and Sexual Activity   Alcohol use: Yes    Alcohol/week: 21.0 standard drinks of alcohol    Types: 21 Cans of beer per week   Drug use: Never   Sexual activity: Not on file  Other Topics Concern   Not on file  Social History Narrative   Not on file   Social Determinants of Health   Financial Resource Strain: Not on file  Food Insecurity: Not on file  Transportation Needs: Not on file  Physical Activity: Not on file  Stress: Not on file  Social Connections: Not on file     Family History: The patient's ***family history includes AAA (abdominal aortic  aneurysm) in her father; Atrial fibrillation in her father; Dementia in her mother; Diabetes in her paternal aunt and paternal grandfather; Glaucoma in her mother; Hypercholesterolemia in her mother and sister; Hypertension in her father; Prostate cancer in her maternal grandfather and maternal uncle; Stroke in her paternal aunt. There is no history of Colon cancer.  ROS:   Please see the history of present illness.    *** All other systems reviewed and are negative.  EKGs/Labs/Other Studies Reviewed:    The following studies were reviewed today: ***  EKG:  EKG is *** ordered today.  The ekg ordered today demonstrates ***  Recent Labs: 08/13/2021: ALT 24  Recent Lipid Panel    Component Value Date/Time   CHOL 214 (H) 08/13/2021 0933   TRIG 127 08/13/2021 0933   HDL 72 08/13/2021 0933   CHOLHDL 3.0 08/13/2021 0933   LDLCALC 120 (H) 08/13/2021 0933     Risk Assessment/Calculations:   {Does this patient have ATRIAL FIBRILLATION?:[phone]}  No BP recorded.  {Refresh Note OR Click here to enter BP  :1}***    STOP-Bang Score:     { Consider Dx Sleep Disordered Breathing or Sleep Apnea  ICD G47.33          :1}     Physical Exam:    VS:  There were no vitals taken for this visit.    Wt Readings from Last 3 Encounters:  12/09/21 188 lb 9.6 oz (85.5 kg)  08/13/21 181 lb 3.2 oz (82.2 kg)  05/21/21 180 lb 3.2 oz (81.7 kg)     GEN: *** Well nourished, well developed in no acute distress HEENT: Normal NECK: No JVD; No carotid bruits LYMPHATICS: No lymphadenopathy CARDIAC: ***RRR, no murmurs, rubs, gallops RESPIRATORY:  Clear to auscultation without rales, wheezing or rhonchi  ABDOMEN: Soft, non-tender, non-distended MUSCULOSKELETAL:  No edema; No deformity  SKIN: Warm and dry NEUROLOGIC:  Alert and oriented x 3 PSYCHIATRIC:  Normal affect   ASSESSMENT:    1. Paroxysmal atrial fibrillation (HCC)   2. Essential hypertension   3. Obstructive sleep apnea   4. Dyslipidemia     PLAN:    In order of problems listed above:  ***      {Are you ordering a CV Procedure (e.g. stress test, cath, DCCV, TEE, etc)?   Press F2        :[id number]}    Medication Adjustments/Labs and Tests Ordered: Current medicines are reviewed at length with the patient today.  Concerns regarding medicines are outlined above.  No orders of the defined types were placed in this encounter.  No orders of the defined types were placed in this encounter.   There are no Patient Instructions on file for this visit.   Signed, [name], NP  06/13/2022 4:47 PM  [facility]

## 2022-06-15 ENCOUNTER — Encounter: Payer: Self-pay | Admitting: Cardiology

## 2022-06-15 ENCOUNTER — Ambulatory Visit: Payer: Medicare PPO | Attending: Cardiology | Admitting: Cardiology

## 2022-06-15 VITALS — BP 135/88 | HR 52 | Ht 66.0 in | Wt 194.0 lb

## 2022-06-15 DIAGNOSIS — I480 Paroxysmal atrial fibrillation: Secondary | ICD-10-CM

## 2022-06-15 DIAGNOSIS — I10 Essential (primary) hypertension: Secondary | ICD-10-CM

## 2022-06-15 DIAGNOSIS — G4733 Obstructive sleep apnea (adult) (pediatric): Secondary | ICD-10-CM

## 2022-06-15 DIAGNOSIS — E785 Hyperlipidemia, unspecified: Secondary | ICD-10-CM | POA: Diagnosis not present

## 2022-06-15 NOTE — Patient Instructions (Signed)
Medication Instructions:   *If you need a refill on your cardiac medications before your next appointment, please call your pharmacy*   Lab Work:  If you have labs (blood work) drawn today and your tests are completely normal, you will receive your results only by: MyChart Message (if you have MyChart) OR A paper copy in the mail If you have any lab test that is abnormal or we need to change your treatment, we will call you to review the results.   Testing/Procedures:    Follow-Up: At [facility], you and your health needs are our priority.  As part of our continuing mission to provide you with exceptional heart care, we have created designated Provider Care Teams.  These Care Teams include your primary Cardiologist (physician) and Advanced Practice Providers (APPs -  Physician Assistants and Nurse Practitioners) who all work together to provide you with the care you need, when you need it.  We recommend signing up for the patient portal called "MyChart".  Sign up information is provided on this After Visit Summary.  MyChart is used to connect with patients for Virtual Visits (Telemedicine).  Patients are able to view lab/test results, encounter notes, upcoming appointments, etc.  Non-urgent messages can be sent to your provider as well.   To learn more about what you can do with MyChart, go to [email or web address].    Your next appointment:   6 month(s)  Provider:   Gypsy Balsam, MD    Other Instructions

## 2022-06-15 NOTE — Progress Notes (Signed)
Cardiology Office Note:    Date:  06/15/2022   ID:  Debra Mueller, DOB 1955/07/22, MRN [id number]  PCP:  Philemon Kingdom, MD   [facility] Providers Cardiologist:  None     Referring MD: Philemon Kingdom, MD   CC: follow-up atrial fibrillation  History of Present Illness:    Debra Mueller is a 67 y.o. female with a hx of aortic atherosclerosis noted on CT abdomen in 2023, paroxysmal atrial fibrillation, OSA on PAP managed by Dr. Mayford Knife, mild AS, hypertension, dyslipidemia.  She establish care with Dr. Bing Matter in March 2023 at the behest of her PCP for PAF.  CHA2DS2-VASc score of 3, her Eliquis was continued.  She was evaluated for OSA, and eventually was started on PAP.  Echocardiogram in March 2023 revealed an EF of 60 to 65%, mild LVH, grade 2 DD, mild MR.  She presents today for follow-up of her atrial fibrillation.  She has been monitoring her heart rate several times throughout the day on her Apple Watch.  Since she was last evaluated in our office she has been in atrial fibs on 2 occasions, on both occasions she took as needed Cardizem and when she woke up the following morning she was back in sinus rhythm.  She has been doing well from a cardiac perspective, she stays very busy working outside around her home, also cares for her 71 year old father.  She is acutely aware when she is in atrial fibrillation, she will have palpitations and endorses feeling terrible. She denies chest pain, palpitations, dyspnea, pnd, orthopnea, n, v, dizziness, syncope, edema, weight gain, or early satiety.   Past Medical History:  Diagnosis Date   Acute postoperative pain 11/18/2020   Anxiety disorder    Aortic atherosclerosis (HCC) 2023   Noted on CT abdomen   Basal cell carcinoma    Capsulitis of metatarsophalangeal (MTP) joint of right foot 12/30/2016   Celiac disease    Chronic seasonal allergic rhinitis 11/11/2015   Closed fracture of distal end of right radius 11/18/2020    Dyslipidemia 03/16/2021   Essential hypertension 11/11/2015   ETOH abuse 03/16/2021   Hammer toe of second toe of right foot 12/30/2016   History of fractured kneecap    Right knee 6 weeks ago 11/19/20   Hypoglycemia 1994   in [place]   Hyposmolality    Mild aortic valve sclerosis    Seen on 12/2013 by Echo   Obstructive sleep apnea 05/21/2021   Paroxysmal atrial fibrillation (HCC) 03/16/2021   RBBB (right bundle branch block)    Retinal hemorrhage     Past Surgical History:  Procedure Laterality Date   ANKLE FRACTURE SURGERY Left 2008   APPENDECTOMY  1975   CATARACT EXTRACTION Bilateral 02/2020   COLONOSCOPY  02/05/2008   Mild Sigmoid diverticulosis. Small internal hemorrhoids. Otherwise normal. Colon Redundant.   COLONOSCOPY  06/11/2013   Moderate sigmoid diverticulosis. otherwise normal. Bx: Prominent benign lymphoid aggregate, otherwise normal   ORIF WRIST FRACTURE Right 11/20/2020   Procedure: OPEN REDUCTION INTERNAL FIXATION (ORIF) WRIST FRACTURE;  Surgeon: Gomez Cleverly, MD;  Location: [facility];  Service: Orthopedics;  Laterality: Right;  with IV sedation needs 90 minutes   TUBAL LIGATION  1997   UPPER GI ENDOSCOPY  02/16/2012   Scalloping of dupdenal mucosa, suggestive of celiac disease. Small Bowel Bx: Findings consistent with Celiac Disease.    Current Medications: Current Meds  Medication Sig   B Complex-C (B-COMPLEX WITH VITAMIN C) tablet Take 1 tablet by  mouth 3 (three) times a week.     Allergies:   Carvedilol, Gluten meal, Lisinopril, Losartan, and Sulfa antibiotics   Social History   Socioeconomic History   Marital status: Married    Spouse name: Not on file   Number of children: Not on file   Years of education: Not on file   Highest education level: Not on file  Occupational History   Not on file  Tobacco Use   Smoking status: Former    Types: Cigarettes    Quit date: 2000    Years since quitting: 24.4   Smokeless tobacco:  Never  Vaping Use   Vaping Use: Never used  Substance and Sexual Activity   Alcohol use: Yes    Alcohol/week: 21.0 standard drinks of alcohol    Types: 21 Cans of beer per week   Drug use: Never   Sexual activity: Not on file  Other Topics Concern   Not on file  Social History Narrative   Not on file   Social Determinants of Health   Financial Resource Strain: Not on file  Food Insecurity: Not on file  Transportation Needs: Not on file  Physical Activity: Not on file  Stress: Not on file  Social Connections: Not on file     Family History: The patient's family history includes AAA (abdominal aortic aneurysm) in her father; Atrial fibrillation in her father; Dementia in her mother; Diabetes in her paternal aunt and paternal grandfather; Glaucoma in her mother; Hypercholesterolemia in her mother and sister; Hypertension in her father; Prostate cancer in her maternal grandfather and maternal uncle; Stroke in her paternal aunt. There is no history of Colon cancer.  ROS:   Please see the history of present illness.     All other systems reviewed and are negative.  EKGs/Labs/Other Studies Reviewed:    The following studies were reviewed today: Cardiac Studies & Procedures       ECHOCARDIOGRAM  ECHOCARDIOGRAM COMPLETE 03/27/2021  Narrative ECHOCARDIOGRAM REPORT    Patient Name:   Debra Mueller Date of Exam: 03/27/2021 Medical Rec #:  [id number]    Height:       66.0 in Accession #:    [id number]   Weight:       185.0 lb Date of Birth:  [birth year]     BSA:          1.935 m Patient Age:    66 years     BP:           166/90 mmHg Patient Gender: F            HR:           57 bpm. Exam Location:  [place]  Procedure: 2D Echo, Cardiac Doppler, Color Doppler and Strain Analysis  Indications:    Essential hypertension [I10 (ICD-10-CM)]  History:        Patient has no prior history of Echocardiogram examinations. Arrythmias:Atrial Fibrillation; Risk  Factors:Hypertension.  Sonographer:    Margreta Journey RDCS Referring Phys: [id number] ROBERT J KRASOWSKI  IMPRESSIONS   1. GLS -17.4. Left ventricular ejection fraction, by estimation, is 60 to 65%. The left ventricle has normal function. The left ventricle has no regional wall motion abnormalities. There is mild left ventricular hypertrophy. Left ventricular diastolic parameters are consistent with Grade II diastolic dysfunction (pseudonormalization). 2. Right ventricular systolic function is normal. The right ventricular size is normal. There is normal pulmonary artery systolic pressure. 3. The mitral valve is normal in structure. Mild mitral  valve regurgitation. No evidence of mitral stenosis. 4. The aortic valve is normal in structure. Aortic valve regurgitation is not visualized. No aortic stenosis is present. 5. The inferior vena cava is normal in size with greater than 50% respiratory variability, suggesting right atrial pressure of 3 mmHg.  FINDINGS Left Ventricle: GLS -17.4. Left ventricular ejection fraction, by estimation, is 60 to 65%. The left ventricle has normal function. The left ventricle has no regional wall motion abnormalities. The left ventricular internal cavity size was normal in size. There is mild left ventricular hypertrophy. Left ventricular diastolic parameters are consistent with Grade II diastolic dysfunction (pseudonormalization).  Right Ventricle: The right ventricular size is normal. No increase in right ventricular wall thickness. Right ventricular systolic function is normal. There is normal pulmonary artery systolic pressure. The tricuspid regurgitant velocity is 2.52 m/s, and with an assumed right atrial pressure of 3 mmHg, the estimated right ventricular systolic pressure is 28.4 mmHg.  Left Atrium: Left atrial size was normal in size.  Right Atrium: Right atrial size was normal in size.  Pericardium: There is no evidence of pericardial  effusion.  Mitral Valve: The mitral valve is normal in structure. Mild mitral valve regurgitation. No evidence of mitral valve stenosis.  Tricuspid Valve: The tricuspid valve is normal in structure. Tricuspid valve regurgitation is mild . No evidence of tricuspid stenosis.  Aortic Valve: The aortic valve is normal in structure. Aortic valve regurgitation is not visualized. No aortic stenosis is present.  Pulmonic Valve: The pulmonic valve was normal in structure. Pulmonic valve regurgitation is not visualized. No evidence of pulmonic stenosis.  Aorta: The aortic root is normal in size and structure.  Venous: The inferior vena cava is normal in size with greater than 50% respiratory variability, suggesting right atrial pressure of 3 mmHg.  IAS/Shunts: No atrial level shunt detected by color flow Doppler.   LEFT VENTRICLE PLAX 2D LVIDd:         4.30 cm   Diastology LVIDs:         2.80 cm   LV e' medial:    5.87 cm/s LV PW:         1.10 cm   LV E/e' medial:  14.0 LV IVS:        1.20 cm   LV e' lateral:   7.07 cm/s LVOT diam:     1.80 cm   LV E/e' lateral: 11.6 LV SV:         68 LV SV Index:   35 LVOT Area:     2.54 cm   RIGHT VENTRICLE             IVC RV Basal diam:  2.00 cm     IVC diam: 1.80 cm RV S prime:     11.60 cm/s TAPSE (M-mode): 2.6 cm  LEFT ATRIUM             Index        RIGHT ATRIUM           Index LA diam:        3.80 cm 1.96 cm/m   RA Area:     13.80 cm LA Vol (A2C):   39.2 ml 20.26 ml/m  RA Volume:   31.30 ml  16.18 ml/m LA Vol (A4C):   30.0 ml 15.51 ml/m LA Biplane Vol: 35.2 ml 18.19 ml/m AORTIC VALVE LVOT Vmax:   111.00 cm/s LVOT Vmean:  73.700 cm/s LVOT VTI:    0.267 m  AORTA Ao  Root diam: 3.00 cm Ao Asc diam:  3.20 cm  MITRAL VALVE               TRICUSPID VALVE MV Area (PHT): 3.27 cm    TR Peak grad:   25.4 mmHg MV Decel Time: 232 msec    TR Vmax:        252.00 cm/s MV E velocity: 82.30 cm/s MV A velocity: 41.60 cm/s  SHUNTS MV E/A ratio:   1.98        Systemic VTI:  0.27 m Systemic Diam: 1.80 cm  Gypsy Balsam MD Electronically signed by Gypsy Balsam MD Signature Date/Time: 03/27/2021/12:31:46 PM    Final              EKG:  EKG is  ordered today.  The ekg ordered today demonstrates sinus bradycardia, heart rate 52 bpm, consistent with prior EKG tracings.  Recent Labs: 08/13/2021: ALT 24  Recent Lipid Panel    Component Value Date/Time   CHOL 214 (H) 08/13/2021 0933   TRIG 127 08/13/2021 0933   HDL 72 08/13/2021 0933   CHOLHDL 3.0 08/13/2021 0933   LDLCALC 120 (H) 08/13/2021 0933     Risk Assessment/Calculations:    CHA2DS2-VASc Score = 3   This indicates a 3.2% annual risk of stroke. The patient's score is based upon: CHF History: 0 HTN History: 1 Diabetes History: 0 Stroke History: 0 Vascular Disease History: 0 Age Score: 1 Gender Score: 1          STOP-Bang Score:          Physical Exam:    VS:  BP 135/88 Comment: Home blood pressure reading  Pulse (!) 52   Ht 5\' 6"  (1.676 m)   Wt 194 lb (88 kg)   SpO2 99%   BMI 31.31 kg/m     Wt Readings from Last 3 Encounters:  06/15/22 194 lb (88 kg)  12/09/21 188 lb 9.6 oz (85.5 kg)  08/13/21 181 lb 3.2 oz (82.2 kg)     GEN:  Well nourished, well developed in no acute distress HEENT: Normal NECK: No JVD; No carotid bruits LYMPHATICS: No lymphadenopathy CARDIAC: RRR, no murmurs, rubs, gallops RESPIRATORY:  Clear to auscultation without rales, wheezing or rhonchi  ABDOMEN: Soft, non-tender, non-distended MUSCULOSKELETAL:  No edema; No deformity  SKIN: Warm and dry NEUROLOGIC:  Alert and oriented x 3 PSYCHIATRIC:  Normal affect   ASSESSMENT:    1. Paroxysmal atrial fibrillation (HCC)   2. Essential hypertension   3. Obstructive sleep apnea   4. Dyslipidemia    PLAN:    In order of problems listed above:  Paroxysmal atrial fibrillation-CHA2DS2-VASc score of 3, today she is in sinus bradycardia, heart rate 52 bpm.  She has had 2  episodes of atrial fibrillation over the last 5 months.  Continue Eliquis 5 mg twice daily--no indication for dose reduction.  Continue Bystolic 5 mg daily.  Continue Cardizem 30 mg as needed for palpitations.  Recent labs 2 weeks ago revealed creatinine 0.60, potassium 4.3, normal LFTs, hemoglobin 13.6, hematocrit 41.8. Hypertension-blood pressure is marginally elevated in office today at 140/90 however was 135 at 88 at home.  She is only on Bystolic 5 mg daily.  Reviewed recent blood pressure readings available in epic and it appears as though her blood pressure is typically well-managed.  For now, we will continue to monitor. OSA-this is managed by Dr. Mayford Knife, she is currently compliant with her CPAP although feels she is still having a  hard time getting used to it.  Encouraged compliance. Dyslipidemia-LDL was elevated at 120 on 08/14/2021, continue Crestor 5 mg daily, she states this is managed by her PCP.  Disposition-return in 6 months.           Medication Adjustments/Labs and Tests Ordered: Current medicines are reviewed at length with the patient today.  Concerns regarding medicines are outlined above.  Orders Placed This Encounter  Procedures   EKG 12-Lead   No orders of the defined types were placed in this encounter.   Patient Instructions  Medication Instructions:   *If you need a refill on your cardiac medications before your next appointment, please call your pharmacy*   Lab Work:  If you have labs (blood work) drawn today and your tests are completely normal, you will receive your results only by: MyChart Message (if you have MyChart) OR A paper copy in the mail If you have any lab test that is abnormal or we need to change your treatment, we will call you to review the results.   Testing/Procedures:    Follow-Up: At [facility], you and your health needs are our priority.  As part of our continuing mission to provide you with exceptional heart care, we  have created designated Provider Care Teams.  These Care Teams include your primary Cardiologist (physician) and Advanced Practice Providers (APPs -  Physician Assistants and Nurse Practitioners) who all work together to provide you with the care you need, when you need it.  We recommend signing up for the patient portal called "MyChart".  Sign up information is provided on this After Visit Summary.  MyChart is used to connect with patients for Virtual Visits (Telemedicine).  Patients are able to view lab/test results, encounter notes, upcoming appointments, etc.  Non-urgent messages can be sent to your provider as well.   To learn more about what you can do with MyChart, go to [email or web address].    Your next appointment:   6 month(s)  Provider:   Gypsy Balsam, MD    Other Instructions    Signed, [name], NP  06/15/2022 12:31 PM    [facility]

## 2022-06-23 DIAGNOSIS — G4733 Obstructive sleep apnea (adult) (pediatric): Secondary | ICD-10-CM | POA: Diagnosis not present

## 2022-07-19 DIAGNOSIS — L814 Other melanin hyperpigmentation: Secondary | ICD-10-CM | POA: Diagnosis not present

## 2022-07-19 DIAGNOSIS — L239 Allergic contact dermatitis, unspecified cause: Secondary | ICD-10-CM | POA: Diagnosis not present

## 2022-07-19 DIAGNOSIS — L821 Other seborrheic keratosis: Secondary | ICD-10-CM | POA: Diagnosis not present

## 2022-07-19 DIAGNOSIS — D692 Other nonthrombocytopenic purpura: Secondary | ICD-10-CM | POA: Diagnosis not present

## 2022-07-19 DIAGNOSIS — L720 Epidermal cyst: Secondary | ICD-10-CM | POA: Diagnosis not present

## 2022-07-19 DIAGNOSIS — Z85828 Personal history of other malignant neoplasm of skin: Secondary | ICD-10-CM | POA: Diagnosis not present

## 2022-07-19 DIAGNOSIS — L918 Other hypertrophic disorders of the skin: Secondary | ICD-10-CM | POA: Diagnosis not present

## 2022-07-19 DIAGNOSIS — C44519 Basal cell carcinoma of skin of other part of trunk: Secondary | ICD-10-CM | POA: Diagnosis not present

## 2022-07-23 DIAGNOSIS — G4733 Obstructive sleep apnea (adult) (pediatric): Secondary | ICD-10-CM | POA: Diagnosis not present

## 2022-08-23 DIAGNOSIS — G4733 Obstructive sleep apnea (adult) (pediatric): Secondary | ICD-10-CM | POA: Diagnosis not present

## 2022-09-16 DIAGNOSIS — G4733 Obstructive sleep apnea (adult) (pediatric): Secondary | ICD-10-CM | POA: Diagnosis not present

## 2022-09-23 DIAGNOSIS — G4733 Obstructive sleep apnea (adult) (pediatric): Secondary | ICD-10-CM | POA: Diagnosis not present

## 2022-12-20 DIAGNOSIS — Z6831 Body mass index (BMI) 31.0-31.9, adult: Secondary | ICD-10-CM | POA: Diagnosis not present

## 2022-12-20 DIAGNOSIS — J180 Bronchopneumonia, unspecified organism: Secondary | ICD-10-CM | POA: Diagnosis not present

## 2022-12-20 DIAGNOSIS — R062 Wheezing: Secondary | ICD-10-CM | POA: Diagnosis not present

## 2022-12-27 DIAGNOSIS — G4733 Obstructive sleep apnea (adult) (pediatric): Secondary | ICD-10-CM | POA: Diagnosis not present

## 2022-12-27 DIAGNOSIS — I480 Paroxysmal atrial fibrillation: Secondary | ICD-10-CM | POA: Diagnosis not present

## 2022-12-27 DIAGNOSIS — Z6831 Body mass index (BMI) 31.0-31.9, adult: Secondary | ICD-10-CM | POA: Diagnosis not present

## 2022-12-27 DIAGNOSIS — I10 Essential (primary) hypertension: Secondary | ICD-10-CM | POA: Diagnosis not present

## 2022-12-27 DIAGNOSIS — Z0000 Encounter for general adult medical examination without abnormal findings: Secondary | ICD-10-CM | POA: Diagnosis not present

## 2022-12-27 DIAGNOSIS — Z79899 Other long term (current) drug therapy: Secondary | ICD-10-CM | POA: Diagnosis not present

## 2022-12-27 DIAGNOSIS — Z1331 Encounter for screening for depression: Secondary | ICD-10-CM | POA: Diagnosis not present

## 2022-12-27 DIAGNOSIS — Z1231 Encounter for screening mammogram for malignant neoplasm of breast: Secondary | ICD-10-CM | POA: Diagnosis not present

## 2022-12-27 DIAGNOSIS — Z7189 Other specified counseling: Secondary | ICD-10-CM | POA: Diagnosis not present

## 2022-12-27 DIAGNOSIS — E785 Hyperlipidemia, unspecified: Secondary | ICD-10-CM | POA: Diagnosis not present

## 2022-12-28 DIAGNOSIS — G4733 Obstructive sleep apnea (adult) (pediatric): Secondary | ICD-10-CM | POA: Diagnosis not present

## 2023-01-01 DIAGNOSIS — L299 Pruritus, unspecified: Secondary | ICD-10-CM | POA: Diagnosis not present

## 2023-02-14 ENCOUNTER — Other Ambulatory Visit: Payer: Self-pay | Admitting: Cardiology

## 2023-02-14 DIAGNOSIS — I480 Paroxysmal atrial fibrillation: Secondary | ICD-10-CM

## 2023-02-14 NOTE — Telephone Encounter (Addendum)
Eliquis 5mg  refill request received. Patient is 68 years old, weight-88kg, Crea-0.72 on 11/26/21 via [employer] tab, Diagnosis-Afib, and last seen by Wallis Bamberg on 06/15/22. Dose is appropriate based on dosing criteria.   Pt needs updated labs. Will call PCP regarding recent labs. Spoke with Patsy Lager at [facility] and she states she would fax over the labs from last year.    At 1059am, labs received via fax. Crea-0.70 on 12/27/22; will send refill to requested pharmacy.

## 2023-02-17 ENCOUNTER — Telehealth: Payer: Self-pay | Admitting: Cardiology

## 2023-02-17 ENCOUNTER — Other Ambulatory Visit: Payer: Self-pay

## 2023-02-17 ENCOUNTER — Telehealth: Payer: Self-pay | Admitting: Emergency Medicine

## 2023-02-17 ENCOUNTER — Ambulatory Visit: Payer: Medicare PPO | Attending: Cardiology | Admitting: Emergency Medicine

## 2023-02-17 VITALS — BP 128/82 | HR 109 | Ht 66.0 in | Wt 192.6 lb

## 2023-02-17 DIAGNOSIS — I480 Paroxysmal atrial fibrillation: Secondary | ICD-10-CM

## 2023-02-17 MED ORDER — APIXABAN 5 MG PO TABS: 5.0000 mg | ORAL_TABLET | Freq: Two times a day (BID) | ORAL | 0 refills | Status: DC

## 2023-02-17 MED ORDER — DILTIAZEM HCL ER COATED BEADS 180 MG PO CP24: 180.0000 mg | ORAL_CAPSULE | Freq: Every day | ORAL | 3 refills | Status: AC

## 2023-02-17 NOTE — Progress Notes (Signed)
   Nurse Visit   Date of Encounter: 02/17/2023 ID: Lucero Auzenne, DOB 06/21/1955, MRN [id number]  PCP:  Jefferey Fitch, MD   [facility] Providers Cardiologist:  None      Visit Details   VS:  BP 128/82   Pulse (!) 109   Ht 5' 6 (1.676 m)   Wt 192 lb 9.6 oz (87.4 kg)   SpO2 98%   BMI 31.09 kg/m  , BMI Body mass index is 31.09 kg/m.  Wt Readings from Last 3 Encounters:  02/17/23 192 lb 9.6 oz (87.4 kg)  06/15/22 194 lb (88 kg)  12/09/21 188 lb 9.6 oz (85.5 kg)     Reason for visit: Possible A-fib Performed today: Vitals, EKG, Provider consulted:Dr. Krasowki , and Education Changes (medications, testing, etc.) : Start Cardizem  CD 180 mg once a day  Length of Visit: 20 minutes    Medications Adjustments/Labs and Tests Ordered: Orders Placed This Encounter  Procedures   EKG 12-Lead   No orders of the defined types were placed in this encounter.    Signed, Ole SHAUNNA Hails, RN  02/17/2023 4:02 PM

## 2023-02-17 NOTE — Telephone Encounter (Signed)
 Prescription refill request for Eliquis  received. Indication: Afib  Last office visit: 06/15/22 Aneita)  Scr: 0.72 (11/27/21) Age: 68 Weight: 87.4kg  Labs overdue. Pt has nurse visit on 02/21/23 and labs can be drawn at this appt. Appropriate dose. Refill sent.

## 2023-02-17 NOTE — Telephone Encounter (Signed)
 Patient c/o Palpitations:  STAT if patient reporting lightheadedness, shortness of breath, or chest pain  How long have you had palpitations/irregular HR/ Afib? Are you having the symptoms now? Roddie and has been continuous since then  Are you currently experiencing lightheadedness, SOB or CP? lightheadedness  Do you have a history of afib (atrial fibrillation) or irregular heart rhythm? Yes  Have you checked your BP or HR? (document readings if available): [id number]  Are you experiencing any other symptoms? No

## 2023-02-17 NOTE — Telephone Encounter (Signed)
 Spoke to patient who reports that she has been in A-fib for 24-36 hours. She reports that she usually takes her diltiazem  and she will return to normal sinus rhythm, but the  diltiazem  has not helped over the last two days. She reports having lightheaded spells when standing. Denies any chest pain. Spoke to DOD (Dr. Monetta) who advised that patient come into the office for a nurse visit for vitals signs and an EKG. Patient agreeable and reports she will be at the office around 3:40 pm.

## 2023-02-17 NOTE — Telephone Encounter (Signed)
 error

## 2023-02-21 ENCOUNTER — Other Ambulatory Visit: Payer: Self-pay

## 2023-02-21 ENCOUNTER — Ambulatory Visit: Payer: Medicare PPO | Attending: Cardiology

## 2023-02-21 DIAGNOSIS — I480 Paroxysmal atrial fibrillation: Secondary | ICD-10-CM

## 2023-02-21 NOTE — Progress Notes (Signed)
   Nurse Visit   Date of Encounter: 02/21/2023 ID: Debra Mueller, DOB 1956/01/01, MRN [id number]  PCP:  Olan Bering, MD   [facility] Providers Cardiologist:  None      Visit Details   VS:  BP (!) 160/88 (BP Location: Left Arm, Patient Position: Sitting, Cuff Size: Normal)   Pulse 60   Wt 192 lb (87.1 kg)   BMI 30.99 kg/m  , BMI Body mass index is 30.99 kg/m.  Wt Readings from Last 3 Encounters:  02/21/23 192 lb (87.1 kg)  02/17/23 192 lb 9.6 oz (87.4 kg)  06/15/22 194 lb (88 kg)     Reason for visit: Perform EKG and have labs drawn Performed today: Vitals, EKG, Education and Provider consulted. Changes (medications, testing, etc.) : No new orders at this time. Length of Visit: 25 minutes    Medications Adjustments/Labs and Tests Ordered: No orders of the defined types were placed in this encounter.  No orders of the defined types were placed in this encounter.    Signed, Dondra Fuel, RN  02/21/2023 1:15 PM

## 2023-02-22 LAB — BASIC METABOLIC PANEL
BUN/Creatinine Ratio: 22 (ref 12–28)
BUN: 14 mg/dL (ref 8–27)
CO2: 23 mmol/L (ref 20–29)
Calcium: 9.3 mg/dL (ref 8.7–10.3)
Chloride: 99 mmol/L (ref 96–106)
Creatinine, Ser: 0.65 mg/dL (ref 0.57–1.00)
Glucose: 83 mg/dL (ref 70–99)
Potassium: 4.5 mmol/L (ref 3.5–5.2)
Sodium: 137 mmol/L (ref 134–144)
eGFR: 96 mL/min/{1.73_m2} (ref >59)

## 2023-02-22 LAB — CBC
Hematocrit: 42.2 % (ref 34.0–46.6)
Hemoglobin: 13.6 g/dL (ref 11.1–15.9)
MCH: 31.3 pg (ref 26.6–33.0)
MCHC: 32.2 g/dL (ref 31.5–35.7)
MCV: 97 fL (ref 79–97)
Platelets: 249 10*3/uL (ref 150–450)
RBC: 4.34 x10E6/uL (ref 3.77–5.28)
RDW: 12.7 % (ref 11.7–15.4)
WBC: 6.9 10*3/uL (ref 3.4–10.8)

## 2023-02-23 ENCOUNTER — Telehealth: Payer: Self-pay

## 2023-02-23 NOTE — Telephone Encounter (Signed)
Lab Results reviewed with pt as per Dr. Vanetta Shawl note.  Pt verbalized understanding and had no additional questions. Routed to PCP

## 2023-03-11 DIAGNOSIS — G4733 Obstructive sleep apnea (adult) (pediatric): Secondary | ICD-10-CM | POA: Diagnosis not present

## 2023-03-18 DIAGNOSIS — Z1231 Encounter for screening mammogram for malignant neoplasm of breast: Secondary | ICD-10-CM | POA: Diagnosis not present

## 2023-03-21 DIAGNOSIS — Z1231 Encounter for screening mammogram for malignant neoplasm of breast: Secondary | ICD-10-CM | POA: Diagnosis not present

## 2023-03-30 DIAGNOSIS — G4733 Obstructive sleep apnea (adult) (pediatric): Secondary | ICD-10-CM | POA: Diagnosis not present

## 2023-04-06 DIAGNOSIS — H04123 Dry eye syndrome of bilateral lacrimal glands: Secondary | ICD-10-CM | POA: Diagnosis not present

## 2023-04-06 DIAGNOSIS — H40013 Open angle with borderline findings, low risk, bilateral: Secondary | ICD-10-CM | POA: Diagnosis not present

## 2023-04-06 DIAGNOSIS — H43813 Vitreous degeneration, bilateral: Secondary | ICD-10-CM | POA: Diagnosis not present

## 2023-04-06 DIAGNOSIS — H35371 Puckering of macula, right eye: Secondary | ICD-10-CM | POA: Diagnosis not present

## 2023-04-06 DIAGNOSIS — H26493 Other secondary cataract, bilateral: Secondary | ICD-10-CM | POA: Diagnosis not present

## 2023-05-16 DIAGNOSIS — Z01419 Encounter for gynecological examination (general) (routine) without abnormal findings: Secondary | ICD-10-CM | POA: Diagnosis not present

## 2023-05-16 DIAGNOSIS — Z8619 Personal history of other infectious and parasitic diseases: Secondary | ICD-10-CM | POA: Diagnosis not present

## 2023-05-16 DIAGNOSIS — Z1151 Encounter for screening for human papillomavirus (HPV): Secondary | ICD-10-CM | POA: Diagnosis not present

## 2023-06-23 DIAGNOSIS — G4733 Obstructive sleep apnea (adult) (pediatric): Secondary | ICD-10-CM | POA: Diagnosis not present

## 2023-06-29 DIAGNOSIS — E785 Hyperlipidemia, unspecified: Secondary | ICD-10-CM | POA: Diagnosis not present

## 2023-06-29 DIAGNOSIS — I480 Paroxysmal atrial fibrillation: Secondary | ICD-10-CM | POA: Diagnosis not present

## 2023-06-29 DIAGNOSIS — Z6831 Body mass index (BMI) 31.0-31.9, adult: Secondary | ICD-10-CM | POA: Diagnosis not present

## 2023-06-29 DIAGNOSIS — D6869 Other thrombophilia: Secondary | ICD-10-CM | POA: Diagnosis not present

## 2023-06-29 DIAGNOSIS — Z79899 Other long term (current) drug therapy: Secondary | ICD-10-CM | POA: Diagnosis not present

## 2023-06-29 DIAGNOSIS — I10 Essential (primary) hypertension: Secondary | ICD-10-CM | POA: Diagnosis not present

## 2023-06-29 DIAGNOSIS — Z1211 Encounter for screening for malignant neoplasm of colon: Secondary | ICD-10-CM | POA: Diagnosis not present

## 2023-06-29 DIAGNOSIS — I872 Venous insufficiency (chronic) (peripheral): Secondary | ICD-10-CM | POA: Diagnosis not present

## 2023-07-05 DIAGNOSIS — Z1211 Encounter for screening for malignant neoplasm of colon: Secondary | ICD-10-CM | POA: Diagnosis not present

## 2023-07-05 DIAGNOSIS — T1512XA Foreign body in conjunctival sac, left eye, initial encounter: Secondary | ICD-10-CM | POA: Diagnosis not present

## 2023-07-05 DIAGNOSIS — Z1212 Encounter for screening for malignant neoplasm of rectum: Secondary | ICD-10-CM | POA: Diagnosis not present

## 2023-07-05 DIAGNOSIS — H04123 Dry eye syndrome of bilateral lacrimal glands: Secondary | ICD-10-CM | POA: Diagnosis not present

## 2023-07-06 DIAGNOSIS — N72 Inflammatory disease of cervix uteri: Secondary | ICD-10-CM | POA: Diagnosis not present

## 2023-07-06 DIAGNOSIS — B977 Papillomavirus as the cause of diseases classified elsewhere: Secondary | ICD-10-CM | POA: Diagnosis not present

## 2023-07-06 DIAGNOSIS — R87810 Cervical high risk human papillomavirus (HPV) DNA test positive: Secondary | ICD-10-CM | POA: Diagnosis not present

## 2023-07-09 LAB — COLOGUARD: COLOGUARD: NEGATIVE

## 2023-08-29 NOTE — Progress Notes (Unsigned)
 Cardiology Office Note:    Date:  08/30/2023   ID:  Tine Mabee, DOB 06-10-1955, MRN [id number]  PCP:  Jefferey Fitch, MD   [facility] Providers Cardiologist:  Lamar Fitch, MD Cardiology APP:  Carlin Delon BROCKS, NP     Referring MD: Jefferey Fitch, MD   History of Present Illness:    Debra Mueller is a 68 y.o. female with a hx of aortic atherosclerosis noted on CT abdomen in 2023, paroxysmal atrial fibrillation, RBBB, OSA on CPAP managed by Dr. Shlomo, mild AS, hypertension, dyslipidemia.  03/27/2021 echo EF of 60 to 65%, mild LVH, grade 2 DD, mild MR.  She established care with Dr. Fitch in March 2023 at the behest of her PCP for PAF.  CHA2DS2-VASc score of 3, her Eliquis  was continued.  She was evaluated for OSA by Dr. Shlomo, and eventually was started on CPAP.  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  She presents today for follow-up of her atrial fibrillation.  She had an episode in February where her heart was out of rhythm for approximately 3 days, she had a nurse visit at our office, she was started on long-acting Cardizem  with plans to follow-up in a few days, upon return visit she had converted.  Since then, she has not had any recurrent episodes of atrial fibrillation.  She is very sensitive to when her heart is out of rhythm.  She does states she is not taking the long-acting Cardizem , felt like it was dropping her heart rate too low.  She has cut down on her alcohol use, feels this is helping her A-fib and I advised her that it was.  She continues to stay very physically active however does not participate in any formal activity. She denies chest pain, palpitations, dyspnea, pnd, orthopnea, n, v, dizziness, syncope, edema, weight  gain, or early satiety.    Past Medical History:  Diagnosis Date   Acute postoperative pain 11/18/2020   Anxiety disorder    Aortic atherosclerosis (HCC) 2023   Noted on CT abdomen   Basal cell carcinoma    Capsulitis of metatarsophalangeal (MTP) joint of right foot 12/30/2016   Celiac disease    Chronic seasonal allergic rhinitis 11/11/2015   Closed fracture of distal end of right radius 11/18/2020   Dyslipidemia 03/16/2021   Essential hypertension 11/11/2015   ETOH abuse 03/16/2021   Hammer toe of second toe of right foot 12/30/2016   History of fractured kneecap    Right knee 6 weeks ago 11/19/20   Hypoglycemia 1994   in [place]   Hyposmolality    Mild aortic valve sclerosis    Seen on 12/2013 by Echo   Obstructive sleep apnea 05/21/2021   Paroxysmal atrial fibrillation (HCC) 03/16/2021   RBBB (right bundle branch block)    Retinal hemorrhage     Past Surgical History:  Procedure Laterality Date   ANKLE FRACTURE SURGERY Left 2008   APPENDECTOMY  1975   CATARACT EXTRACTION Bilateral 02/2020   COLONOSCOPY  02/05/2008   Mild Sigmoid diverticulosis. Small internal hemorrhoids. Otherwise normal. Colon Redundant.   COLONOSCOPY  06/11/2013   Moderate sigmoid diverticulosis. otherwise normal. Bx: Prominent benign lymphoid aggregate, otherwise normal   ORIF WRIST FRACTURE Right 11/20/2020   Procedure: OPEN REDUCTION INTERNAL FIXATION (ORIF) WRIST  FRACTURE;  Surgeon: Alyse Agent, MD;  Location: [facility];  Service: Orthopedics;  Laterality: Right;  with IV sedation needs 90 minutes   TUBAL LIGATION  1997   UPPER GI ENDOSCOPY  02/16/2012   Scalloping of dupdenal mucosa, suggestive of celiac disease. Small Bowel Bx: Findings consistent with Celiac Disease.    Current Medications: Current Meds  Medication Sig   apixaban  (ELIQUIS ) 5 MG TABS tablet Take 1 tablet (5 mg total) by mouth 2 (two) times daily.   B Complex-C (B-COMPLEX WITH VITAMIN C) tablet Take  1 tablet by mouth 3 (three) times a week.   Calcium  Carb-Cholecalciferol (CALCIUM  500 + D3 PO) Take 1 tablet by mouth daily.   Coenzyme Q10-Fish Oil-Vit E (CO-Q 10 OMEGA-3 FISH OIL PO) Take 1 capsule by mouth 2 (two) times daily.   diltiazem  (CARDIZEM  CD) 180 MG 24 hr capsule Take 1 capsule (180 mg total) by mouth daily.   diltiazem  (CARDIZEM ) 30 MG tablet Take 30 mg by mouth every 6 (six) hours as needed (palpitations).   mometasone (NASONEX) 50 MCG/ACT nasal spray Place 2 sprays into the nose every evening.   montelukast (SINGULAIR) 10 MG tablet Take 10 mg by mouth at bedtime.   nebivolol (BYSTOLIC) 5 MG tablet Take 5 mg by mouth daily.   rosuvastatin  (CRESTOR ) 5 MG tablet Take 5 mg by mouth daily.   sertraline (ZOLOFT) 25 MG tablet Take 25 mg by mouth daily.     Allergies:   Carvedilol, Gluten meal, Lisinopril, Losartan, and Sulfa antibiotics   Social History   Socioeconomic History   Marital status: Married    Spouse name: Not on file   Number of children: Not on file   Years of education: Not on file   Highest education level: Not on file  Occupational History   Not on file  Tobacco Use   Smoking status: Former    Current packs/day: 0.00    Types: Cigarettes    Quit date: 2000    Years since quitting: 25.6   Smokeless tobacco: Never  Vaping Use   Vaping status: Never Used  Substance and Sexual Activity   Alcohol use: Yes    Alcohol/week: 21.0 standard drinks of alcohol    Types: 21 Cans of beer per week   Drug use: Never   Sexual activity: Not on file  Other Topics Concern   Not on file  Social History Narrative   Not on file   Social Drivers of Health   Financial Resource Strain: Not on file  Food Insecurity: Not on file  Transportation Needs: Not on file  Physical Activity: Not on file  Stress: Not on file  Social Connections: Not on file     Family History: The patient's family history includes AAA (abdominal aortic aneurysm) in her father; Atrial  fibrillation in her father; Dementia in her mother; Diabetes in her paternal aunt and paternal grandfather; Glaucoma in her mother; Hypercholesterolemia in her mother and sister; Hypertension in her father; Prostate cancer in her maternal grandfather and maternal uncle; Stroke in her paternal aunt. There is no history of Colon cancer.  ROS:   Please see the history of present illness.     All other systems reviewed and are negative.  EKGs/Labs/Other Studies Reviewed:    The following studies were reviewed today: Cardiac Studies & Procedures   ______________________________________________________________________________________________     ECHOCARDIOGRAM  ECHOCARDIOGRAM COMPLETE 03/27/2021  Narrative ECHOCARDIOGRAM REPORT    Patient Name:   DEZARAI PREW  Date of Exam: 03/27/2021 Medical Rec #:  [id number]    Height:       66.0 in Accession #:    [id number]   Weight:       185.0 lb Date of Birth:  [birth year]     BSA:          1.935 m Patient Age:    66 years     BP:           166/90 mmHg Patient Gender: F            HR:           57 bpm. Exam Location:  [facility]  Procedure: 2D Echo, Cardiac Doppler, Color Doppler and Strain Analysis  Indications:    Essential hypertension [I10 (ICD-10-CM)]  History:        Patient has no prior history of Echocardiogram examinations. Arrythmias:Atrial Fibrillation; Risk Factors:Hypertension.  Sonographer:    Charlie Jointer RDCS Referring Phys: [id number] ROBERT J KRASOWSKI  IMPRESSIONS   1. GLS -17.4. Left ventricular ejection fraction, by estimation, is 60 to 65%. The left ventricle has normal function. The left ventricle has no regional wall motion abnormalities. There is mild left ventricular hypertrophy. Left ventricular diastolic parameters are consistent with Grade II diastolic dysfunction (pseudonormalization). 2. Right ventricular systolic function is normal. The right ventricular size is normal. There is normal pulmonary artery systolic  pressure. 3. The mitral valve is normal in structure. Mild mitral valve regurgitation. No evidence of mitral stenosis. 4. The aortic valve is normal in structure. Aortic valve regurgitation is not visualized. No aortic stenosis is present. 5. The inferior vena cava is normal in size with greater than 50% respiratory variability, suggesting right atrial pressure of 3 mmHg.  FINDINGS Left Ventricle: GLS -17.4. Left ventricular ejection fraction, by estimation, is 60 to 65%. The left ventricle has normal function. The left ventricle has no regional wall motion abnormalities. The left ventricular internal cavity size was normal in size. There is mild left ventricular hypertrophy. Left ventricular diastolic parameters are consistent with Grade II diastolic dysfunction (pseudonormalization).  Right Ventricle: The right ventricular size is normal. No increase in right ventricular wall thickness. Right ventricular systolic function is normal. There is normal pulmonary artery systolic pressure. The tricuspid regurgitant velocity is 2.52 m/s, and with an assumed right atrial pressure of 3 mmHg, the estimated right ventricular systolic pressure is 28.4 mmHg.  Left Atrium: Left atrial size was normal in size.  Right Atrium: Right atrial size was normal in size.  Pericardium: There is no evidence of pericardial effusion.  Mitral Valve: The mitral valve is normal in structure. Mild mitral valve regurgitation. No evidence of mitral valve stenosis.  Tricuspid Valve: The tricuspid valve is normal in structure. Tricuspid valve regurgitation is mild . No evidence of tricuspid stenosis.  Aortic Valve: The aortic valve is normal in structure. Aortic valve regurgitation is not visualized. No aortic stenosis is present.  Pulmonic Valve: The pulmonic valve was normal in structure. Pulmonic valve regurgitation is not visualized. No evidence of pulmonic stenosis.  Aorta: The aortic root is normal in size and  structure.  Venous: The inferior vena cava is normal in size with greater than 50% respiratory variability, suggesting right atrial pressure of 3 mmHg.  IAS/Shunts: No atrial level shunt detected by color flow Doppler.   LEFT VENTRICLE PLAX 2D LVIDd:         4.30 cm   Diastology LVIDs:         2.80  cm   LV e' medial:    5.87 cm/s LV PW:         1.10 cm   LV E/e' medial:  14.0 LV IVS:        1.20 cm   LV e' lateral:   7.07 cm/s LVOT diam:     1.80 cm   LV E/e' lateral: 11.6 LV SV:         68 LV SV Index:   35 LVOT Area:     2.54 cm   RIGHT VENTRICLE             IVC RV Basal diam:  2.00 cm     IVC diam: 1.80 cm RV S prime:     11.60 cm/s TAPSE (M-mode): 2.6 cm  LEFT ATRIUM             Index        RIGHT ATRIUM           Index LA diam:        3.80 cm 1.96 cm/m   RA Area:     13.80 cm LA Vol (A2C):   39.2 ml 20.26 ml/m  RA Volume:   31.30 ml  16.18 ml/m LA Vol (A4C):   30.0 ml 15.51 ml/m LA Biplane Vol: 35.2 ml 18.19 ml/m AORTIC VALVE LVOT Vmax:   111.00 cm/s LVOT Vmean:  73.700 cm/s LVOT VTI:    0.267 m  AORTA Ao Root diam: 3.00 cm Ao Asc diam:  3.20 cm  MITRAL VALVE               TRICUSPID VALVE MV Area (PHT): 3.27 cm    TR Peak grad:   25.4 mmHg MV Decel Time: 232 msec    TR Vmax:        252.00 cm/s MV E velocity: 82.30 cm/s MV A velocity: 41.60 cm/s  SHUNTS MV E/A ratio:  1.98        Systemic VTI:  0.27 m Systemic Diam: 1.80 cm  Lamar Fitch MD Electronically signed by Lamar Fitch MD Signature Date/Time: 03/27/2021/12:31:46 PM    Final          ______________________________________________________________________________________________       EKG:  EKG is  ordered today.  The ekg ordered today demonstrates sinus bradycardia, heart rate 52 bpm, consistent with prior EKG tracings.  Recent Labs: 02/21/2023: BUN 14; Creatinine, Ser 0.65; Hemoglobin 13.6; Platelets 249; Potassium 4.5; Sodium 137  Recent Lipid Panel    Component Value  Date/Time   CHOL 214 (H) 08/13/2021 0933   TRIG 127 08/13/2021 0933   HDL 72 08/13/2021 0933   CHOLHDL 3.0 08/13/2021 0933   LDLCALC 120 (H) 08/13/2021 0933     Risk Assessment/Calculations:    CHA2DS2-VASc Score =     This indicates a  % annual risk of stroke. The patient's score is based upon:            STOP-Bang Score:          Physical Exam:    VS:  BP 124/70   Pulse (!) 55   Ht 5' 6 (1.676 m)   Wt 192 lb (87.1 kg)   SpO2 98%   BMI 30.99 kg/m     Wt Readings from Last 3 Encounters:  08/30/23 192 lb (87.1 kg)  02/21/23 192 lb (87.1 kg)  02/17/23 192 lb 9.6 oz (87.4 kg)     GEN:  Well nourished, well developed in no acute distress HEENT: Normal  NECK: No JVD; No carotid bruits LYMPHATICS: No lymphadenopathy CARDIAC: RRR, no murmurs, rubs, gallops RESPIRATORY:  Clear to auscultation without rales, wheezing or rhonchi  ABDOMEN: Soft, non-tender, non-distended MUSCULOSKELETAL:  No edema; No deformity  SKIN: Warm and dry NEUROLOGIC:  Alert and oriented x 3 PSYCHIATRIC:  Normal affect   ASSESSMENT:    1. Paroxysmal atrial fibrillation (HCC)   2. Hypercoagulable state (HCC)   3. Essential hypertension   4. OSA (obstructive sleep apnea)   5. Mixed hyperlipidemia     PLAN:    In order of problems listed above:  Paroxysmal atrial fibrillation-CHA2DS2-VASc score of 3, maintaining sinus rhythm.  Continue Eliquis  5 mg twice daily--no indication for dose reduction.  Continue Bystolic 5 mg daily.  Continue Cardizem  30 mg as needed for palpitations--was started on Cardizem  180 daily during episode of A-fib and as outlined above in the HPI however she is not currently taking this.  Had lab work by her PCP a few weeks ago and we will reach out to their office for copy of results, she denies hematochezia, hematuria, hemoptysis.  Hypertension-blood pressure is well-controlled today at 124/70, continue Bystolic 5 mg daily.   OSA-this is managed by Dr. Shlomo, she is  currently compliant with her CPAP although feels she is still having a hard time getting used to it.  Encouraged compliance.  Dyslipidemia-LDL was elevated at 120 on 08/14/2021, continue Crestor  5 mg daily, she states this is managed by her PCP.  Disposition-request labs from PCP, follow up in 6 months.            Medication Adjustments/Labs and Tests Ordered: Current medicines are reviewed at length with the patient today.  Concerns regarding medicines are outlined above.  No orders of the defined types were placed in this encounter.  No orders of the defined types were placed in this encounter.   Patient Instructions  Medication Instructions:    *If you need a refill on your cardiac medications before your next appointment, please call your pharmacy*  Lab Work:   If you have labs (blood work) drawn today and your tests are completely normal, you will receive your results only by: MyChart Message (if you have MyChart) OR A paper copy in the mail If you have any lab test that is abnormal or we need to change your treatment, we will call you to review the results.  Testing/Procedures:    Follow-Up: At [facility], you and your health needs are our priority.  As part of our continuing mission to provide you with exceptional heart care, our providers are all part of one team.  This team includes your primary Cardiologist (physician) and Advanced Practice Providers or APPs (Physician Assistants and Nurse Practitioners) who all work together to provide you with the care you need, when you need it.  Your next appointment:   6 month(s)  Provider:   6 months.   We recommend signing up for the patient portal called MyChart.  Sign up information is provided on this After Visit Summary.  MyChart is used to connect with patients for Virtual Visits (Telemedicine).  Patients are able to view lab/test results, encounter notes, upcoming appointments, etc.  Non-urgent messages  can be sent to your provider as well.   To learn more about what you can do with MyChart, go to [email or web address].   Other Instructions Have a great rest of your summer!!       Signed, Delon JAYSON Hoover, NP  08/30/2023 11:48 AM  [facility]

## 2023-08-30 ENCOUNTER — Encounter: Payer: Self-pay | Admitting: Cardiology

## 2023-08-30 ENCOUNTER — Ambulatory Visit: Attending: Cardiology | Admitting: Cardiology

## 2023-08-30 VITALS — BP 124/70 | HR 55 | Ht 66.0 in | Wt 192.0 lb

## 2023-08-30 DIAGNOSIS — I480 Paroxysmal atrial fibrillation: Secondary | ICD-10-CM

## 2023-08-30 DIAGNOSIS — G4733 Obstructive sleep apnea (adult) (pediatric): Secondary | ICD-10-CM | POA: Diagnosis not present

## 2023-08-30 DIAGNOSIS — D6859 Other primary thrombophilia: Secondary | ICD-10-CM

## 2023-08-30 DIAGNOSIS — I10 Essential (primary) hypertension: Secondary | ICD-10-CM | POA: Diagnosis not present

## 2023-08-30 DIAGNOSIS — E782 Mixed hyperlipidemia: Secondary | ICD-10-CM

## 2023-08-30 NOTE — Patient Instructions (Signed)
 Medication Instructions:    *If you need a refill on your cardiac medications before your next appointment, please call your pharmacy*  Lab Work:   If you have labs (blood work) drawn today and your tests are completely normal, you will receive your results only by: MyChart Message (if you have MyChart) OR A paper copy in the mail If you have any lab test that is abnormal or we need to change your treatment, we will call you to review the results.  Testing/Procedures:    Follow-Up: At [facility], you and your health needs are our priority.  As part of our continuing mission to provide you with exceptional heart care, our providers are all part of one team.  This team includes your primary Cardiologist (physician) and Advanced Practice Providers or APPs (Physician Assistants and Nurse Practitioners) who all work together to provide you with the care you need, when you need it.  Your next appointment:   6 month(s)  Provider:   6 months.   We recommend signing up for the patient portal called MyChart.  Sign up information is provided on this After Visit Summary.  MyChart is used to connect with patients for Virtual Visits (Telemedicine).  Patients are able to view lab/test results, encounter notes, upcoming appointments, etc.  Non-urgent messages can be sent to your provider as well.   To learn more about what you can do with MyChart, go to [email or web address].   Other Instructions Have a great rest of your summer!!

## 2023-09-20 DIAGNOSIS — G4733 Obstructive sleep apnea (adult) (pediatric): Secondary | ICD-10-CM | POA: Diagnosis not present

## 2023-10-19 DIAGNOSIS — Z85828 Personal history of other malignant neoplasm of skin: Secondary | ICD-10-CM | POA: Diagnosis not present

## 2023-10-19 DIAGNOSIS — D1801 Hemangioma of skin and subcutaneous tissue: Secondary | ICD-10-CM | POA: Diagnosis not present

## 2023-10-19 DIAGNOSIS — B078 Other viral warts: Secondary | ICD-10-CM | POA: Diagnosis not present

## 2023-10-19 DIAGNOSIS — L821 Other seborrheic keratosis: Secondary | ICD-10-CM | POA: Diagnosis not present

## 2023-10-19 DIAGNOSIS — L814 Other melanin hyperpigmentation: Secondary | ICD-10-CM | POA: Diagnosis not present

## 2023-10-20 ENCOUNTER — Encounter: Payer: Self-pay | Admitting: Gastroenterology

## 2023-11-16 ENCOUNTER — Other Ambulatory Visit: Payer: Self-pay | Admitting: Cardiology

## 2023-11-16 DIAGNOSIS — I480 Paroxysmal atrial fibrillation: Secondary | ICD-10-CM

## 2023-11-16 NOTE — Telephone Encounter (Signed)
 Eliquis  5mg  refill request received. Patient is 68 years old, weight-87.1kg, Crea-0.65 on 02/21/23, Diagnosis-Afib, and last seen by Delon Hoover on 08/30/23. Dose is appropriate based on dosing criteria. Will send in refill to requested pharmacy.

## 2023-12-13 DIAGNOSIS — G4733 Obstructive sleep apnea (adult) (pediatric): Secondary | ICD-10-CM | POA: Diagnosis not present

## 2024-01-18 ENCOUNTER — Telehealth: Payer: Self-pay | Admitting: Gastroenterology

## 2024-01-18 NOTE — Telephone Encounter (Signed)
 Call from pt stating she received a letter to schedule colonoscopy. Pt declined procedure at this time due to negative cologuard screen. Pt stated provider recommended next screening in 3 years. Pt unsure if she should have results sent here. Please advise. Thank you

## 2024-01-18 NOTE — Telephone Encounter (Signed)
 Patient had a negative Cologuard this past year.  Has a remote of polyps many years ago, followed by a normal colonoscopy after that. Seeking your advice about cologuard versus colonoscoy in the future and at what time.

## 2024-01-20 NOTE — Telephone Encounter (Signed)
 Spoke with patient and provided Dr Ira recommendation. Recall for screening colonoscopy placed. Advised patient to notify us  if she has any changes in bowel habits or other concerns. Patient voiced understanding.
